# Patient Record
Sex: Male | Born: 1965 | Race: White | Hispanic: No | Marital: Married | Smoking: Never smoker
Health system: Southern US, Community
[De-identification: ages and names within clinical notes are randomized; demographics above are authoritative.]

## PROBLEM LIST (undated history)

## (undated) DIAGNOSIS — N400 Enlarged prostate without lower urinary tract symptoms: Secondary | ICD-10-CM

## (undated) DIAGNOSIS — I82409 Acute embolism and thrombosis of unspecified deep veins of unspecified lower extremity: Secondary | ICD-10-CM

## (undated) DIAGNOSIS — N200 Calculus of kidney: Secondary | ICD-10-CM

## (undated) DIAGNOSIS — G0490 Encephalitis and encephalomyelitis, unspecified: Secondary | ICD-10-CM

## (undated) DIAGNOSIS — R739 Hyperglycemia, unspecified: Secondary | ICD-10-CM

## (undated) DIAGNOSIS — I10 Essential (primary) hypertension: Secondary | ICD-10-CM

## (undated) DIAGNOSIS — E785 Hyperlipidemia, unspecified: Secondary | ICD-10-CM

## (undated) DIAGNOSIS — E119 Type 2 diabetes mellitus without complications: Secondary | ICD-10-CM

## (undated) HISTORY — DX: Benign prostatic hyperplasia without lower urinary tract symptoms: N40.0

## (undated) HISTORY — DX: Calculus of kidney: N20.0

## (undated) HISTORY — DX: Acute embolism and thrombosis of unspecified deep veins of unspecified lower extremity: I82.409

## (undated) HISTORY — DX: Hyperlipidemia, unspecified: E78.5

## (undated) HISTORY — DX: Essential (primary) hypertension: I10

## (undated) HISTORY — DX: Hyperglycemia, unspecified: R73.9

## (undated) HISTORY — DX: Encephalitis and encephalomyelitis, unspecified: G04.90

---
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
## 2001-02-21 ENCOUNTER — Ambulatory Visit (HOSPITAL_COMMUNITY): Admission: RE | Admit: 2001-02-21 | Discharge: 2001-02-21 | Payer: Self-pay | Admitting: Neurosurgery

## 2001-02-21 ENCOUNTER — Encounter: Payer: Self-pay | Admitting: Neurosurgery

## 2005-03-23 ENCOUNTER — Ambulatory Visit: Payer: Self-pay | Admitting: Internal Medicine

## 2005-03-31 ENCOUNTER — Ambulatory Visit: Payer: Self-pay

## 2007-08-14 ENCOUNTER — Observation Stay: Payer: Self-pay | Admitting: Specialist

## 2008-04-27 ENCOUNTER — Ambulatory Visit: Payer: Self-pay | Admitting: Urology

## 2008-04-28 ENCOUNTER — Ambulatory Visit: Payer: Self-pay | Admitting: Internal Medicine

## 2008-07-28 ENCOUNTER — Ambulatory Visit: Payer: Self-pay | Admitting: Internal Medicine

## 2008-08-06 ENCOUNTER — Ambulatory Visit: Payer: Self-pay | Admitting: Internal Medicine

## 2008-08-27 ENCOUNTER — Ambulatory Visit: Payer: Self-pay | Admitting: Internal Medicine

## 2013-01-28 ENCOUNTER — Telehealth: Payer: Self-pay | Admitting: Internal Medicine

## 2013-01-28 NOTE — Telephone Encounter (Signed)
Pt did see you over at [facility]. He has not seen you yet over at this office. He was seeing Dr. And they found nerve blockage (Fascicular) on EKG it is not Arterial Blockage. Pt is wanting to know if this was always there or something new. He is trying to get a new job and they a denying him until they find out if this was always there or something new.

## 2013-01-28 NOTE — Telephone Encounter (Signed)
Need the information from the physician he is seeing now and I need the information from [place] for comparison

## 2013-01-28 NOTE — Telephone Encounter (Signed)
Request for records has been sent. Waiting on results.

## 2013-01-30 NOTE — Telephone Encounter (Signed)
Received records

## 2013-02-03 ENCOUNTER — Telehealth: Payer: Self-pay | Admitting: General Practice

## 2013-02-03 NOTE — Telephone Encounter (Signed)
Pt called back regarding his physical and EKG that had been completed incorrectly. Spoke with [facility] last week.   Ths is for an overseas mission, pt seen Visual merchandiser in [place]. Provider stated that he was not ok with the pt going on the mission trip stated that if PCP called and discussed or was ok with him going on the trip then he would sign the papers. This was suppose to have been completed within in 5 days. Tomorrow will be the 7th day due to weather. Please call back and advise pt what to do.

## 2013-02-03 NOTE — Telephone Encounter (Signed)
Records from [place] and [facility] with ekgs are being sent.

## 2013-02-05 ENCOUNTER — Telehealth: Payer: Self-pay | Admitting: Internal Medicine

## 2013-02-05 NOTE — Telephone Encounter (Signed)
Patient wants to know if he needs to see a Cardiologist or what should he do at this point.

## 2013-02-06 ENCOUNTER — Encounter: Payer: Self-pay | Admitting: Internal Medicine

## 2013-02-06 NOTE — Telephone Encounter (Signed)
See other message

## 2013-02-07 ENCOUNTER — Encounter: Payer: Self-pay | Admitting: Internal Medicine

## 2013-02-07 ENCOUNTER — Ambulatory Visit (INDEPENDENT_AMBULATORY_CARE_PROVIDER_SITE_OTHER): Payer: 59 | Admitting: Internal Medicine

## 2013-02-07 VITALS — BP 120/84 | HR 74 | Temp 98.6°F | Ht 68.5 in | Wt 191.8 lb

## 2013-02-07 DIAGNOSIS — Z0000 Encounter for general adult medical examination without abnormal findings: Secondary | ICD-10-CM

## 2013-02-07 DIAGNOSIS — R739 Hyperglycemia, unspecified: Secondary | ICD-10-CM

## 2013-02-07 DIAGNOSIS — N200 Calculus of kidney: Secondary | ICD-10-CM

## 2013-02-07 DIAGNOSIS — R7309 Other abnormal glucose: Secondary | ICD-10-CM

## 2013-02-07 DIAGNOSIS — E7800 Pure hypercholesterolemia, unspecified: Secondary | ICD-10-CM

## 2013-02-07 DIAGNOSIS — Z86718 Personal history of other venous thrombosis and embolism: Secondary | ICD-10-CM

## 2013-02-09 ENCOUNTER — Encounter: Payer: Self-pay | Admitting: Internal Medicine

## 2013-02-09 DIAGNOSIS — E1159 Type 2 diabetes mellitus with other circulatory complications: Secondary | ICD-10-CM | POA: Insufficient documentation

## 2013-02-09 DIAGNOSIS — Z86718 Personal history of other venous thrombosis and embolism: Secondary | ICD-10-CM | POA: Insufficient documentation

## 2013-02-09 DIAGNOSIS — E119 Type 2 diabetes mellitus without complications: Secondary | ICD-10-CM | POA: Insufficient documentation

## 2013-02-09 DIAGNOSIS — I152 Hypertension secondary to endocrine disorders: Secondary | ICD-10-CM | POA: Insufficient documentation

## 2013-02-09 DIAGNOSIS — E7800 Pure hypercholesterolemia, unspecified: Secondary | ICD-10-CM | POA: Insufficient documentation

## 2013-02-09 DIAGNOSIS — N200 Calculus of kidney: Secondary | ICD-10-CM | POA: Insufficient documentation

## 2013-02-09 NOTE — Progress Notes (Signed)
  Subjective:    Patient ID: Jackson Cooper, male    DOB: 10-06-1966, 47 y.o.   MRN: [id number]  HPI 47 year old male with past history of DVT, nephrolithiasis and hyperglycemia who comes in today as a work in to discuss a recent EKG.  He is planning to go on another mission trip.  Was being evaluated by [facility] in [place].  Was found to have a fascicular block on his EKG.  Was asked to have me evaluate him.  He states he is doing well.  Stays physically active and reports no chest pain or tightness with increased activity or exertion.  Working at [employer], [employer].  Very active.  No sob.  Overall he feels he is doing well.  No acid reflux or nausea.  Bowels doing well.     Past Medical History  Diagnosis Date  . Enlarged prostate   . Encephalitis 1970  . DVT (deep venous thrombosis) Diagnosed 9/08  . Nephrolithiasis   . Hyperglycemia     Current Outpatient Prescriptions on File Prior to Visit  Medication Sig Dispense Refill  . aspirin 81 MG tablet Take 81 mg by mouth daily. Take 1 tablet by mouth once a day      . cetirizine (ZYRTEC) 10 MG tablet Take 10 mg by mouth daily.      . Flaxseed Oil OIL by Does not apply route. 1000 mg      . LORazepam (ATIVAN) 0.5 MG tablet Take 0.5 mg by mouth every 8 (eight) hours. 1 tablet as directed       No current facility-administered medications on file prior to visit.    Review of Systems Patient denies any headache, lightheadedness or dizziness.  No sinus or allergy symptoms.  No chest pain, tightness or palpitations.  No increased shortness of breath, cough or congestion.  No nausea or vomiting.  No acid reflux.  No abdominal pain or cramping.  No bowel change, such as diarrhea, constipation, BRBPR or melana.  No urine change.  Overall he feels good.  Stays physically active and reports no cardiac symptoms with increased activity or exertion.       Objective:   Physical Exam Filed Vitals:   02/07/13 1150  BP: 120/84  Pulse: 74   Temp: 98.6 F (37 C)   Pulse 28  47 year old male in no acute distress.  HEENT:  Nares - clear.  Oropharynx - without lesions. NECK:  Supple.  Nontender.  No audible carotid bruit.  HEART:  Appears to be regular.   LUNGS:  No crackles or wheezing audible.  Respirations even and unlabored.   RADIAL PULSE:  Equal bilaterally.  ABDOMEN:  Soft.  Nontender.  Bowel sounds present and normal.  No audible abdominal bruit.    EXTREMITIES:  No increased edema present.  DP pulses palpable and equal bilaterally.         Assessment & Plan:  CARDIOVASCULAR.  Repeat EKG today revealed SR with left anterior fascicular block and no acute ischemic changes.  He is asymptomatic.  Stays physically active and reports no cardiac symptoms with increased activity or exertion.  Will have cardiology review to confirm no further w/up warranted.    PULMONARY.  Asymptomatic.    HEALTH MAINTENANCE.  Overdue a physical here.  Will schedule.  Colonoscopy 2002.

## 2013-02-09 NOTE — Assessment & Plan Note (Signed)
Low cholesterol diet and exercise.  Check lipid panel if not performed on his recent labs.  Obtain records for review.

## 2013-02-09 NOTE — Assessment & Plan Note (Signed)
States he just had labs performed through the [facility] evaluation.  Obtain records.  States sugar was "ok".

## 2013-02-09 NOTE — Assessment & Plan Note (Signed)
If off coumadin.  No reoccurrence.  Needs to get up and move around frequently with his flight - if he goes on the mission trip.

## 2013-02-09 NOTE — Assessment & Plan Note (Signed)
Asymptomatic.  Follow.   

## 2013-02-10 ENCOUNTER — Encounter: Payer: Self-pay | Admitting: Internal Medicine

## 2013-02-10 ENCOUNTER — Telehealth: Payer: Self-pay | Admitting: *Deleted

## 2013-02-10 NOTE — Telephone Encounter (Signed)
Patient called wanting to know if you have approved him yet? Patient would like a call back to let him know statis.

## 2013-02-10 NOTE — Telephone Encounter (Signed)
This is the one I talked to you about

## 2013-02-10 NOTE — Telephone Encounter (Signed)
Patient stated that this issue must be resolved tomorrow.

## 2013-02-11 ENCOUNTER — Telehealth: Payer: Self-pay | Admitting: Internal Medicine

## 2013-02-11 NOTE — Telephone Encounter (Signed)
Patient notified of appointment with cardiologist at 2:00 pm 02/11/13

## 2013-02-11 NOTE — Telephone Encounter (Signed)
See other message

## 2013-02-11 NOTE — Telephone Encounter (Signed)
I have tried to call pt this am and have left him a message to call back.  I need to talk to him when he calls.  I have spoke to cardiology and they have reviewed his ekg.

## 2013-02-11 NOTE — Telephone Encounter (Signed)
Pt called to let you know he just finished his stress test. Please call him with results

## 2013-02-11 NOTE — Telephone Encounter (Signed)
Patient notified

## 2013-02-11 NOTE — Telephone Encounter (Signed)
Call pt and let him know that Dr Gwen Pounds should have the information I need by tomorrow.

## 2013-02-12 ENCOUNTER — Encounter: Payer: Self-pay | Admitting: Internal Medicine

## 2013-02-14 ENCOUNTER — Telehealth: Payer: Self-pay | Admitting: Emergency Medicine

## 2013-02-14 NOTE — Telephone Encounter (Signed)
Patient came in requesting an order for a CXR prior to his apt on Monday. He would like to do this before coming in.Jackson KitchenMarland KitchenCould you place an order for this for a screening, due to employer request? Please advise

## 2013-02-14 NOTE — Telephone Encounter (Signed)
Will need to see him before ordering.

## 2013-02-14 NOTE — Telephone Encounter (Signed)
I don't know how I can order this before I see him.  After I see him, I will send him straight to xray from the appt. And it  Will still be done on Monday.  The TB skin test can be placed while he is here and can be read in three days after placed.

## 2013-02-14 NOTE — Telephone Encounter (Signed)
Patient said he needs cxr and tb skin test because his job is requesting it. He wants it done Monday if possible. I explained to him that you need to see him before ordering.

## 2013-02-17 ENCOUNTER — Encounter: Payer: Self-pay | Admitting: Internal Medicine

## 2013-02-17 ENCOUNTER — Ambulatory Visit (INDEPENDENT_AMBULATORY_CARE_PROVIDER_SITE_OTHER): Payer: 59 | Admitting: Internal Medicine

## 2013-02-17 ENCOUNTER — Ambulatory Visit (INDEPENDENT_AMBULATORY_CARE_PROVIDER_SITE_OTHER)
Admission: RE | Admit: 2013-02-17 | Discharge: 2013-02-17 | Disposition: A | Discharge status: Home / Self Care | Payer: 59 | Source: Ambulatory Visit | Attending: Internal Medicine | Admitting: Internal Medicine

## 2013-02-17 VITALS — BP 120/96 | HR 74 | Temp 98.7°F | Ht 68.5 in | Wt 193.2 lb

## 2013-02-17 DIAGNOSIS — Z86718 Personal history of other venous thrombosis and embolism: Secondary | ICD-10-CM

## 2013-02-17 DIAGNOSIS — R739 Hyperglycemia, unspecified: Secondary | ICD-10-CM

## 2013-02-17 DIAGNOSIS — E7800 Pure hypercholesterolemia, unspecified: Secondary | ICD-10-CM

## 2013-02-17 DIAGNOSIS — Z111 Encounter for screening for respiratory tuberculosis: Secondary | ICD-10-CM

## 2013-02-17 DIAGNOSIS — R7309 Other abnormal glucose: Secondary | ICD-10-CM

## 2013-02-17 DIAGNOSIS — N200 Calculus of kidney: Secondary | ICD-10-CM

## 2013-02-17 NOTE — Telephone Encounter (Signed)
Forwarding this to you.

## 2013-02-17 NOTE — Telephone Encounter (Signed)
Patient is aware 

## 2013-02-18 ENCOUNTER — Encounter: Payer: Self-pay | Admitting: Internal Medicine

## 2013-02-18 NOTE — Assessment & Plan Note (Signed)
Asymptomatic.  Follow.   

## 2013-02-18 NOTE — Assessment & Plan Note (Signed)
If off coumadin.  No reoccurrence.  Needs to get up and move around frequently with his flight - if he goes on the mission trip.

## 2013-02-18 NOTE — Progress Notes (Signed)
Subjective:    Patient ID: Jackson Cooper, male    DOB: 06-05-1966, 47 y.o.   MRN: [id number]  HPI 47 year old male with past history of DVT, nephrolithiasis and hyperglycemia who comes in today for a scheduled follow up and physical exam.   He is planning to go on another mission trip.  Was being evaluated by [facility] in [place].  Was found to have a fascicular block on his EKG.  Was asked to have me evaluate him.  See last note for details.  Had treadmill stress test.  Did well.  Stays physically active.  No chest pain or tightness with increased activity or exertion.  No sob.  No acid reflux.  States he has had extensive blood work recently.  States everything checked out fine.  No bowel change.  Needs a cxr and TB skin test.  Overall feels good.     Past Medical History  Diagnosis Date  . Enlarged prostate   . Encephalitis 1970  . DVT (deep venous thrombosis) Diagnosed 9/08  . Nephrolithiasis   . Hyperglycemia     Current Outpatient Prescriptions on File Prior to Visit  Medication Sig Dispense Refill  . aspirin 81 MG tablet Take 81 mg by mouth daily. Take 1 tablet by mouth once a day      . cetirizine (ZYRTEC) 10 MG tablet Take 10 mg by mouth daily.      . Flaxseed Oil OIL by Does not apply route. 1000 mg      . LORazepam (ATIVAN) 0.5 MG tablet Take 0.5 mg by mouth every 8 (eight) hours. 1 tablet as directed       No current facility-administered medications on file prior to visit.    Review of Systems Patient denies any headache, lightheadedness or dizziness.  No sinus or allergy symptoms.  No chest pain, tightness or palpitations.  No increased shortness of breath, cough or congestion.  No nausea or vomiting.  No acid reflux.  No abdominal pain or cramping.  No bowel change, such as diarrhea, constipation, BRBPR or melana.  No urine change.  Overall he feels good.  Stays physically active and reports no cardiac symptoms with increased activity or exertion.       Objective:   Physical Exam  Filed Vitals:   02/17/13 1435  BP: 120/96  Pulse: 74  Temp: 98.7 F (37.1 C)   Pulse 68, blood pressure recheck:  38-54/30  47 year old male in no acute distress.  HEENT:  Nares - clear.  Oropharynx - without lesions. NECK:  Supple.  Nontender.  No audible carotid bruit.  HEART:  Appears to be regular.   LUNGS:  No crackles or wheezing audible.  Respirations even and unlabored.   RADIAL PULSE:  Equal bilaterally.  ABDOMEN:  Soft.  Nontender.  Bowel sounds present and normal.  No audible abdominal bruit.  GU/PROSTATE:  Just checked.     EXTREMITIES:  No increased edema present.  DP pulses palpable and equal bilaterally.         Assessment & Plan:  CARDIOVASCULAR.  Repeat EKG last visit revealed SR with left anterior fascicular block.  He had an exercise treadmill test.  Did well.  Physically active with no symptoms.  Follow.      PULMONARY.  Asymptomatic.  Required to have a TB skin test and cxr.  TB skin test placed today.  CXR today.  ELEVATED BLOOD PRESSURE.  Noted on today's exam.  States he will spot check  his pressure and let me know if remains elevated.  He feels it is up today secondary to increased stress of preparing for this trip.  Follow.  Obtain recent labs.    HEALTH MAINTENANCE.  Physical today.  GU and prostate just checked.  Obtain records of most recent physical and labs.   Colonoscopy 2002.

## 2013-02-18 NOTE — Assessment & Plan Note (Signed)
Low cholesterol diet and exercise.  Check lipid panel if not performed on his recent labs.  Obtain records for review.

## 2013-02-18 NOTE — Assessment & Plan Note (Signed)
States he just had labs performed through the [facility] evaluation.  Obtain records.  States sugar was "ok".

## 2013-02-20 ENCOUNTER — Ambulatory Visit (INDEPENDENT_AMBULATORY_CARE_PROVIDER_SITE_OTHER): Payer: 59 | Admitting: *Deleted

## 2013-02-20 ENCOUNTER — Telehealth: Payer: Self-pay | Admitting: *Deleted

## 2013-02-20 DIAGNOSIS — Z111 Encounter for screening for respiratory tuberculosis: Secondary | ICD-10-CM

## 2013-02-20 NOTE — Addendum Note (Signed)
Addended by: Marlene Lard on: 02/20/2013 11:38 AM   Modules accepted: Orders

## 2013-02-20 NOTE — Telephone Encounter (Signed)
Patient was in the office this morning to have his tb results read. He requested rx for Flonase. Patient stated that Flonase works better for him the zyrtec.

## 2013-02-21 MED ORDER — FLUTICASONE PROPIONATE 50 MCG/ACT NA SUSP: 2.0000 | Freq: Every day | NASAL | Status: AC

## 2013-02-21 NOTE — Telephone Encounter (Signed)
Notify pt I sent in the Flonase rx to [facility] [address].  Let us know if problems.

## 2013-02-21 NOTE — Telephone Encounter (Signed)
Patient notified

## 2013-03-13 NOTE — Progress Notes (Signed)
Pt came into the office for a PPD skin test.

## 2013-06-11 ENCOUNTER — Telehealth: Payer: Self-pay | Admitting: Internal Medicine

## 2013-06-11 NOTE — Telephone Encounter (Signed)
Pt.notified

## 2013-06-11 NOTE — Telephone Encounter (Signed)
Pt states at last visit he and Dr. Lorin Picket discussed drug malarium but he never received it.  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  Pharmacy = [facility] [place].  Pt asking for call so that he can pick it up asap.  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

## 2013-06-11 NOTE — Telephone Encounter (Signed)
I always refer people to the travel clinic for recommendations prior to going to places like he is going to.  They will give him the medications needed and make sure all immunizations are updated.  This is what he will need to do.  There is a travel clinic in [place].

## 2013-06-12 ENCOUNTER — Encounter: Payer: 59 | Admitting: Internal Medicine

## 2013-10-02 ENCOUNTER — Other Ambulatory Visit: Payer: Self-pay

## 2014-05-19 ENCOUNTER — Telehealth: Payer: Self-pay | Admitting: Internal Medicine

## 2014-05-19 NOTE — Telephone Encounter (Signed)
Noted.  Any problems or increasing blood pressure, will need to be seen before 05/22/14 appt.

## 2014-05-19 NOTE — Telephone Encounter (Signed)
Please advise where you would like to put patient on your schedule.

## 2014-05-19 NOTE — Telephone Encounter (Signed)
Spoke with pt, BP yesterday 164/91, today 149/98. Denies any CP, SOB, any acute symptoms. States he has been trying to watch his diet, but has been working in [place] and foods all high in salt. Appt scheduled for 05/22/14 at 1:30.

## 2014-05-19 NOTE — Telephone Encounter (Signed)
I can see him on 05/22/14 at 1:30 - block 30 minute slot.  Need to confirm how high blood pressure and if any other acute symptoms.  If acute symptoms or problems, needs evaluation today and then I can f/u on Friday.  Thanks.

## 2014-05-19 NOTE — Telephone Encounter (Signed)
[place] and his blood pressure is elevated . He wife called and stated that he needs to see Dr. Lorin PicketScott.

## 2014-05-20 NOTE — Telephone Encounter (Signed)
Pt aware.

## 2014-05-22 ENCOUNTER — Encounter: Payer: Self-pay | Admitting: Internal Medicine

## 2014-05-22 ENCOUNTER — Telehealth: Payer: Self-pay | Admitting: *Deleted

## 2014-05-22 ENCOUNTER — Ambulatory Visit: Payer: 59 | Admitting: Adult Health

## 2014-05-22 ENCOUNTER — Ambulatory Visit (INDEPENDENT_AMBULATORY_CARE_PROVIDER_SITE_OTHER): Payer: 59 | Admitting: Internal Medicine

## 2014-05-22 VITALS — BP 120/84 | HR 76 | Temp 98.1°F | Ht 68.5 in | Wt 201.5 lb

## 2014-05-22 DIAGNOSIS — I10 Essential (primary) hypertension: Secondary | ICD-10-CM

## 2014-05-22 DIAGNOSIS — R7309 Other abnormal glucose: Secondary | ICD-10-CM

## 2014-05-22 DIAGNOSIS — E7800 Pure hypercholesterolemia, unspecified: Secondary | ICD-10-CM

## 2014-05-22 DIAGNOSIS — N200 Calculus of kidney: Secondary | ICD-10-CM

## 2014-05-22 DIAGNOSIS — R739 Hyperglycemia, unspecified: Secondary | ICD-10-CM

## 2014-05-22 DIAGNOSIS — Z86718 Personal history of other venous thrombosis and embolism: Secondary | ICD-10-CM

## 2014-05-22 DIAGNOSIS — R9431 Abnormal electrocardiogram [ECG] [EKG]: Secondary | ICD-10-CM

## 2014-05-22 MED ORDER — AMLODIPINE BESYLATE 5 MG PO TABS: 5.0000 mg | ORAL_TABLET | Freq: Every day | ORAL | Status: DC

## 2014-05-22 NOTE — Telephone Encounter (Signed)
Pt is coming Monday what labs and dx? 

## 2014-05-22 NOTE — Patient Instructions (Signed)
Orders placed for labs

## 2014-05-22 NOTE — Telephone Encounter (Signed)
Orders placed for labs

## 2014-05-25 ENCOUNTER — Encounter: Payer: Self-pay | Admitting: Internal Medicine

## 2014-05-25 ENCOUNTER — Other Ambulatory Visit (INDEPENDENT_AMBULATORY_CARE_PROVIDER_SITE_OTHER): Payer: 59

## 2014-05-25 DIAGNOSIS — I10 Essential (primary) hypertension: Secondary | ICD-10-CM

## 2014-05-25 DIAGNOSIS — R7309 Other abnormal glucose: Secondary | ICD-10-CM

## 2014-05-25 DIAGNOSIS — R9431 Abnormal electrocardiogram [ECG] [EKG]: Secondary | ICD-10-CM | POA: Insufficient documentation

## 2014-05-25 DIAGNOSIS — R739 Hyperglycemia, unspecified: Secondary | ICD-10-CM

## 2014-05-25 DIAGNOSIS — E7800 Pure hypercholesterolemia, unspecified: Secondary | ICD-10-CM

## 2014-05-25 LAB — CBC WITH DIFFERENTIAL/PLATELET
BASOS ABS: 0 10*3/uL (ref 0.0–0.1)
Basophils Relative: 0.5 % (ref 0.0–3.0)
EOS PCT: 2.3 % (ref 0.0–5.0)
Eosinophils Absolute: 0.1 10*3/uL (ref 0.0–0.7)
HEMATOCRIT: 47.5 % (ref 39.0–52.0)
Hemoglobin: 15.9 g/dL (ref 13.0–17.0)
LYMPHS ABS: 1.9 10*3/uL (ref 0.7–4.0)
LYMPHS PCT: 29.9 % (ref 12.0–46.0)
MCHC: 33.5 g/dL (ref 30.0–36.0)
MCV: 91.1 fl (ref 78.0–100.0)
MONOS PCT: 8.5 % (ref 3.0–12.0)
Monocytes Absolute: 0.5 10*3/uL (ref 0.1–1.0)
Neutro Abs: 3.7 10*3/uL (ref 1.4–7.7)
Neutrophils Relative %: 58.8 % (ref 43.0–77.0)
PLATELETS: 193 10*3/uL (ref 150.0–400.0)
RBC: 5.22 Mil/uL (ref 4.22–5.81)
RDW: 13.3 % (ref 11.5–15.5)
WBC: 6.2 10*3/uL (ref 4.0–10.5)

## 2014-05-25 LAB — COMPREHENSIVE METABOLIC PANEL
ALBUMIN: 4.6 g/dL (ref 3.5–5.2)
ALT: 37 U/L (ref 0–53)
AST: 29 U/L (ref 0–37)
Alkaline Phosphatase: 56 U/L (ref 39–117)
BILIRUBIN TOTAL: 1.1 mg/dL (ref 0.2–1.2)
BUN: 14 mg/dL (ref 6–23)
CALCIUM: 9.4 mg/dL (ref 8.4–10.5)
CHLORIDE: 106 meq/L (ref 96–112)
CO2: 25 meq/L (ref 19–32)
Creatinine, Ser: 1.1 mg/dL (ref 0.4–1.5)
GFR: 79.53 mL/min (ref >60.00)
GLUCOSE: 159 mg/dL — AB (ref 70–99)
POTASSIUM: 4.5 meq/L (ref 3.5–5.1)
SODIUM: 140 meq/L (ref 135–145)
TOTAL PROTEIN: 7.3 g/dL (ref 6.0–8.3)

## 2014-05-25 LAB — LIPID PANEL
CHOL/HDL RATIO: 5
Cholesterol: 199 mg/dL (ref 0–200)
HDL: 37.7 mg/dL — AB (ref >39.00)
LDL Cholesterol: 138 mg/dL — ABNORMAL HIGH (ref 0–99)
NONHDL: 161.3
Triglycerides: 117 mg/dL (ref 0.0–149.0)
VLDL: 23.4 mg/dL (ref 0.0–40.0)

## 2014-05-25 LAB — URINALYSIS, ROUTINE W REFLEX MICROSCOPIC
BILIRUBIN URINE: NEGATIVE
Hgb urine dipstick: NEGATIVE
KETONES UR: NEGATIVE
LEUKOCYTES UA: NEGATIVE
Nitrite: NEGATIVE
RBC / HPF: NONE SEEN (ref 0–?)
SPECIFIC GRAVITY, URINE: 1.02 (ref 1.000–1.030)
Total Protein, Urine: NEGATIVE
URINE GLUCOSE: NEGATIVE
UROBILINOGEN UA: 0.2 (ref 0.0–1.0)
WBC, UA: NONE SEEN (ref 0–?)
pH: 6 (ref 5.0–8.0)

## 2014-05-25 LAB — HEMOGLOBIN A1C: HEMOGLOBIN A1C: 7.3 % — AB (ref 4.6–6.5)

## 2014-05-25 LAB — MICROALBUMIN / CREATININE URINE RATIO
MICROALB/CREAT RATIO: 0.3 mg/g (ref 0.0–30.0)
Microalb, Ur: 0.6 mg/dL (ref 0.0–1.9)

## 2014-05-25 LAB — TSH: TSH: 1.73 u[IU]/mL (ref 0.35–4.50)

## 2014-05-25 NOTE — Assessment & Plan Note (Signed)
Blood pressure elevated.  Will check metabolic panel.  Given that he will be in [place] and is returning next week, I will start amlodipine 5mg  q day.  (will hold on starting an ACE inhibitor since renal function and potassium will need to be monitored).  Instructed him that he will need close monitoring of his blood pressure and adjustments made if necessary.  To f/u with me when he returns.

## 2014-05-25 NOTE — Assessment & Plan Note (Signed)
Asymptomatic.  Follow.   

## 2014-05-25 NOTE — Assessment & Plan Note (Signed)
Low cholesterol diet and exercise.  Check lipid panel.   

## 2014-05-25 NOTE — Progress Notes (Signed)
Subjective:    Patient ID: Jackson DouglasRicky L Mckeel, male    DOB: 1965-12-01, 48 y.o.   MRN: [id number]  Hypertension  48 year old male with past history of DVT, nephrolithiasis and hyperglycemia who comes in today as a work in with concerns regarding elevated blood pressure.   He just returned home from [place] and is planning to leave again next week.  While home, he has noticed his blood pressure is elevated.  Has been averaging 158-160s/90s.  He feels this is related to his lifestyle change while in [place].  States he has started exercising some now.  Has adjusted his diet some.  Reports eating foods with more sodium while in [place].  Has noticed that his blood pressure seems to be trending down.  He brings in a form to complete to clear him for physical fitness testing.  Of note, he had a treadmill stress test last year.  Did well.  Stays physically active.  No chest pain or tightness with increased activity or exertion.  No sob.  No acid reflux.      Past Medical History  Diagnosis Date  . Enlarged prostate   . Encephalitis 1970  . DVT (deep venous thrombosis) Diagnosed 9/08  . Nephrolithiasis   . Hyperglycemia     Current Outpatient Prescriptions on File Prior to Visit  Medication Sig Dispense Refill  . aspirin 81 MG tablet Take 81 mg by mouth daily. Take 1 tablet by mouth once a day      . cetirizine (ZYRTEC) 10 MG tablet Take 10 mg by mouth daily as needed.       . Flaxseed Oil OIL by Does not apply route. 1000 mg      . fluticasone (FLONASE) 50 MCG/ACT nasal spray Place 2 sprays into the nose daily.  16 g  2  . LORazepam (ATIVAN) 0.5 MG tablet Take 0.5 mg by mouth every 8 (eight) hours. 1 tablet as directed       No current facility-administered medications on file prior to visit.    Review of Systems Patient denies any headache, lightheadedness or dizziness.  No sinus or allergy symptoms.  No chest pain, tightness or palpitations.  No increased shortness of breath, cough or congestion.  No  nausea or vomiting.  No acid reflux.  No abdominal pain or cramping.  No bowel change, such as diarrhea, constipation, BRBPR or melana.  No urine change.  Overall he feels good.  Stays physically active and reports no cardiac symptoms with increased activity or exertion.  Has gained some weight.        Objective:   Physical Exam  Filed Vitals:   05/22/14 1304  BP: 120/84  Pulse: 76  Temp: 98.1 F (36.7 C)   Blood pressure recheck:  138/94-96, pulse 176  48 year old male in no acute distress.  HEENT:  Nares - clear.  Oropharynx - without lesions. NECK:  Supple.  Nontender.  No audible carotid bruit.  HEART:  Appears to be regular.   LUNGS:  No crackles or wheezing audible.  Respirations even and unlabored.   RADIAL PULSE:  Equal bilaterally.  ABDOMEN:  Soft.  Nontender.  Bowel sounds present and normal.  No audible abdominal bruit.      EXTREMITIES:  No increased edema present.  DP pulses palpable and equal bilaterally.         Assessment & Plan:  CARDIOVASCULAR.  Repeat EKG today revealed SR with TWI in III and minimal TWI in aVF.  Some minimal ST elevation in v2.  Given these findings, will have cardiology review.  Needs clearance for the physical abilities test.  Dr Gwen PoundsKowalski to review.  Discussed with pt.  May need appt.        PULMONARY.  Asymptomatic.   HEALTH MAINTENANCE.  Overdue a physical.    Colonoscopy 2002.  Will need f/u colonoscopy.    I spent 25 minutes with the patient and more than 50% of the time was spent in consultation regarding the above.

## 2014-05-25 NOTE — Assessment & Plan Note (Signed)
Needs clearance for physical abilities test.  EKG as outlined.  Will have cardiology review for clearance.

## 2014-05-25 NOTE — Assessment & Plan Note (Signed)
Low carb diet and exercise.  Follow.  Check met b and a1c.    

## 2014-05-25 NOTE — Assessment & Plan Note (Signed)
If off coumadin.  No reoccurrence.  Needs to get up and move around frequently with his flight.

## 2014-05-26 ENCOUNTER — Encounter: Payer: Self-pay | Admitting: *Deleted

## 2014-07-31 ENCOUNTER — Encounter: Payer: Self-pay | Admitting: Internal Medicine

## 2014-07-31 MED ORDER — AMLODIPINE BESYLATE 5 MG PO TABS: 5.0000 mg | ORAL_TABLET | Freq: Every day | ORAL | Status: DC

## 2014-07-31 NOTE — Telephone Encounter (Signed)
My chart message sent to pt notifying of refill sent in and to let us know when home so can schedule physical.

## 2014-11-16 ENCOUNTER — Other Ambulatory Visit: Payer: Self-pay | Admitting: *Deleted

## 2014-11-16 MED ORDER — AMLODIPINE BESYLATE 5 MG PO TABS: 5.0000 mg | ORAL_TABLET | Freq: Every day | ORAL | Status: DC

## 2015-05-17 ENCOUNTER — Other Ambulatory Visit: Payer: Self-pay | Admitting: Internal Medicine

## 2015-06-16 ENCOUNTER — Other Ambulatory Visit: Payer: Self-pay

## 2015-06-16 MED ORDER — AMLODIPINE BESYLATE 5 MG PO TABS: 5.0000 mg | ORAL_TABLET | Freq: Every day | ORAL | Status: DC

## 2015-06-16 NOTE — Telephone Encounter (Signed)
ok'd refill until September appt.  Needs to keep this appt for more refills.

## 2015-06-16 NOTE — Telephone Encounter (Signed)
Patient's pharmacy requested refill.  Last month refill was done with only 30day supply, patient needed appointment.  Patient has an appointment but it is in September.  Please advise refill?

## 2015-07-20 ENCOUNTER — Telehealth: Payer: Self-pay | Admitting: Internal Medicine

## 2015-07-20 DIAGNOSIS — R739 Hyperglycemia, unspecified: Secondary | ICD-10-CM

## 2015-07-20 DIAGNOSIS — I10 Essential (primary) hypertension: Secondary | ICD-10-CM

## 2015-07-20 DIAGNOSIS — E7800 Pure hypercholesterolemia, unspecified: Secondary | ICD-10-CM

## 2015-07-20 NOTE — Telephone Encounter (Signed)
I have ordered the labs.  Please schedule lab appt prior to cpe.  Thanks

## 2015-07-20 NOTE — Telephone Encounter (Signed)
Pt would like to know is any blood work due before his CPE? IF so need orders. Thank You!

## 2015-07-20 NOTE — Telephone Encounter (Signed)
Please advise lab orders.

## 2015-07-30 ENCOUNTER — Ambulatory Visit: Payer: 59 | Admitting: Internal Medicine

## 2015-08-17 ENCOUNTER — Other Ambulatory Visit: Payer: Self-pay | Admitting: Internal Medicine

## 2015-08-17 NOTE — Telephone Encounter (Signed)
Last OV 6.26.15.  Please advise refill

## 2015-08-18 NOTE — Telephone Encounter (Signed)
ok'd refill amlodipine #30 with no refills.  Instructed to keep scheduled appt.

## 2015-09-13 ENCOUNTER — Other Ambulatory Visit: Payer: Self-pay | Admitting: Internal Medicine

## 2015-09-13 ENCOUNTER — Encounter: Payer: Self-pay | Admitting: Internal Medicine

## 2015-09-13 ENCOUNTER — Ambulatory Visit (INDEPENDENT_AMBULATORY_CARE_PROVIDER_SITE_OTHER): Payer: BC Managed Care – PPO | Admitting: Internal Medicine

## 2015-09-13 VITALS — BP 120/80 | HR 61 | Temp 98.1°F | Resp 18 | Ht 68.5 in | Wt 198.2 lb

## 2015-09-13 DIAGNOSIS — Z658 Other specified problems related to psychosocial circumstances: Secondary | ICD-10-CM

## 2015-09-13 DIAGNOSIS — E7800 Pure hypercholesterolemia, unspecified: Secondary | ICD-10-CM

## 2015-09-13 DIAGNOSIS — F439 Reaction to severe stress, unspecified: Secondary | ICD-10-CM

## 2015-09-13 DIAGNOSIS — I10 Essential (primary) hypertension: Secondary | ICD-10-CM

## 2015-09-13 DIAGNOSIS — R739 Hyperglycemia, unspecified: Secondary | ICD-10-CM | POA: Diagnosis not present

## 2015-09-13 LAB — HEPATIC FUNCTION PANEL
ALBUMIN: 4.1 g/dL (ref 3.5–5.2)
ALT: 26 U/L (ref 0–53)
AST: 21 U/L (ref 0–37)
Alkaline Phosphatase: 58 U/L (ref 39–117)
Bilirubin, Direct: 0.1 mg/dL (ref 0.0–0.3)
TOTAL PROTEIN: 6.4 g/dL (ref 6.0–8.3)
Total Bilirubin: 0.8 mg/dL (ref 0.2–1.2)

## 2015-09-13 LAB — LIPID PANEL
CHOLESTEROL: 194 mg/dL (ref 0–200)
HDL: 33.7 mg/dL — AB (ref >39.00)
LDL CALC: 138 mg/dL — AB (ref 0–99)
NonHDL: 159.86
Total CHOL/HDL Ratio: 6
Triglycerides: 110 mg/dL (ref 0.0–149.0)
VLDL: 22 mg/dL (ref 0.0–40.0)

## 2015-09-13 LAB — BASIC METABOLIC PANEL
BUN: 15 mg/dL (ref 6–23)
CALCIUM: 9.3 mg/dL (ref 8.4–10.5)
CO2: 28 mEq/L (ref 19–32)
Chloride: 104 mEq/L (ref 96–112)
Creatinine, Ser: 1.02 mg/dL (ref 0.40–1.50)
GFR: 81.81 mL/min (ref >60.00)
Glucose, Bld: 156 mg/dL — ABNORMAL HIGH (ref 70–99)
POTASSIUM: 4.1 meq/L (ref 3.5–5.1)
SODIUM: 140 meq/L (ref 135–145)

## 2015-09-13 LAB — CBC WITH DIFFERENTIAL/PLATELET
BASOS PCT: 0.6 % (ref 0.0–3.0)
Basophils Absolute: 0 10*3/uL (ref 0.0–0.1)
EOS ABS: 0.1 10*3/uL (ref 0.0–0.7)
EOS PCT: 2.1 % (ref 0.0–5.0)
HEMATOCRIT: 45.8 % (ref 39.0–52.0)
HEMOGLOBIN: 15.4 g/dL (ref 13.0–17.0)
LYMPHS PCT: 34.7 % (ref 12.0–46.0)
Lymphs Abs: 1.9 10*3/uL (ref 0.7–4.0)
MCHC: 33.6 g/dL (ref 30.0–36.0)
MCV: 90.2 fl (ref 78.0–100.0)
MONO ABS: 0.4 10*3/uL (ref 0.1–1.0)
Monocytes Relative: 7.9 % (ref 3.0–12.0)
Neutro Abs: 3 10*3/uL (ref 1.4–7.7)
Neutrophils Relative %: 54.7 % (ref 43.0–77.0)
Platelets: 180 10*3/uL (ref 150.0–400.0)
RBC: 5.07 Mil/uL (ref 4.22–5.81)
RDW: 13.1 % (ref 11.5–15.5)
WBC: 5.5 10*3/uL (ref 4.0–10.5)

## 2015-09-13 LAB — HEMOGLOBIN A1C: HEMOGLOBIN A1C: 7.6 % — AB (ref 4.6–6.5)

## 2015-09-13 LAB — MICROALBUMIN / CREATININE URINE RATIO
CREATININE, U: 249.9 mg/dL
MICROALB UR: 1 mg/dL (ref 0.0–1.9)
Microalb Creat Ratio: 0.4 mg/g (ref 0.0–30.0)

## 2015-09-13 LAB — TSH: TSH: 0.89 u[IU]/mL (ref 0.35–4.50)

## 2015-09-13 MED ORDER — LISINOPRIL 10 MG PO TABS: 10.0000 mg | ORAL_TABLET | Freq: Every day | ORAL | Status: DC

## 2015-09-13 MED ORDER — AMLODIPINE BESYLATE 5 MG PO TABS: 5.0000 mg | ORAL_TABLET | Freq: Every day | ORAL | Status: DC

## 2015-09-13 NOTE — Progress Notes (Signed)
Order placed for f/u met b - in two weeks.  

## 2015-09-13 NOTE — Progress Notes (Signed)
Patient ID: Jackson Cooper, male   DOB: [birth year], 49 y.o.   MRN: [id number]   Subjective:    Patient ID: Jackson Cooper, male    DOB: [birth year], 49 y.o.   MRN: [id number]  HPI  Patient with past history of hypertension, hyperglycemia and hypercholesterolemia who comes in today to follow up on these issues.  I have not seen him in over one year.  States he has been under increased stress with his job.  Blood pressure has been elevated.  States blood pressure is averaging 160/95.  No significant headache.  Does report some neck issues.  States this will lead to headache if he is positioned wrong through the night.  No dizziness or light headedness reported.  No chest pain or tightness.  No sob.  No acid reflux reported.  Bowels doing well.  Taking amlodipine regularly.     Past Medical History  Diagnosis Date  . Enlarged prostate   . Encephalitis 1970  . DVT (deep venous thrombosis) ([place]) Diagnosed 9/08  . Nephrolithiasis   . Hyperglycemia    Past Surgical History  Procedure Laterality Date  . Eye surgery  1972   Family History  Problem Relation Age of Onset  . Hypertension Mother   . Breast cancer Sister    Social History   Social History  . Marital Status: Married    Spouse Name: N/A  . Number of Children: N/A  . Years of Education: N/A   Social History Main Topics  . Smoking status: Never Smoker   . Smokeless tobacco: Never Used  . Alcohol Use: No  . Drug Use: No  . Sexual Activity: Not Asked   Other Topics Concern  . None   Social History Narrative    Outpatient Encounter Prescriptions as of 09/13/2015  Medication Sig  . amLODipine (NORVASC) 5 MG tablet Take 1 tablet (5 mg total) by mouth daily.  [name] aspirin 81 MG tablet Take 81 mg by mouth daily. Take 1 tablet by mouth once a day  . cetirizine (ZYRTEC) 10 MG tablet Take 10 mg by mouth daily as needed.   . Flaxseed Oil OIL by Does not apply route. 1000 mg  . fluticasone (FLONASE) 50 MCG/ACT nasal spray Place 2  sprays into the nose daily.  [name] LORazepam (ATIVAN) 0.5 MG tablet Take 0.5 mg by mouth every 8 (eight) hours. 1 tablet as directed  . [DISCONTINUED] amLODipine (NORVASC) 5 MG tablet Take 1 tablet (5 mg total) by mouth daily. Keep scheduled appt.  [name] lisinopril (PRINIVIL,ZESTRIL) 10 MG tablet Take 1 tablet (10 mg total) by mouth daily.   No facility-administered encounter medications on file as of 09/13/2015.    Review of Systems  Constitutional: Negative for appetite change and unexpected weight change.       Not watching his sugar.  Not watching what he eats.  States he tries some, but is not real serious about his diet.    HENT: Negative for congestion and sinus pressure.   Eyes: Negative for discharge and visual disturbance.  Respiratory: Negative for cough, chest tightness and shortness of breath.   Cardiovascular: Negative for chest pain, palpitations and leg swelling.  Gastrointestinal: Negative for nausea, vomiting, abdominal pain and diarrhea.  Genitourinary: Negative for dysuria and difficulty urinating.  Musculoskeletal: Negative for back pain and joint swelling.  Neurological: Positive for headaches (occasional.  he feels related to his neck.  ). Negative for dizziness and light-headedness.  Psychiatric/Behavioral: Negative for dysphoric mood and agitation.  Increased stress as outlined.         Objective:     Blood pressure rechecked by me:  140/84  Physical Exam  Constitutional: He appears well-developed and well-nourished. No distress.  HENT:  Nose: Nose normal.  Mouth/Throat: Oropharynx is clear and moist.  Eyes: Conjunctivae are normal. Right eye exhibits no discharge. Left eye exhibits no discharge.  Neck: Neck supple. No thyromegaly present.  Cardiovascular: Normal rate and regular rhythm.   Pulmonary/Chest: Effort normal and breath sounds normal. No respiratory distress.  Abdominal: Soft. Bowel sounds are normal. There is no tenderness.  Musculoskeletal: He  exhibits no edema or tenderness.  Lymphadenopathy:    He has no cervical adenopathy.  Skin: No rash noted. No erythema.  Psychiatric: He has a normal mood and affect. His behavior is normal.    BP 120/80 mmHg  Pulse 61  Temp(Src) 98.1 F (36.7 C) (Oral)  Resp 18  Ht 5' 8.5" (1.74 m)  Wt 198 lb 4 oz (89.926 kg)  BMI 29.70 kg/m2  SpO2 96% Wt Readings from Last 3 Encounters:  09/13/15 198 lb 4 oz (89.926 kg)  05/22/14 201 lb 8 oz (91.4 kg)  02/17/13 193 lb 4 oz (87.658 kg)     Lab Results  Component Value Date   WBC 6.2 05/25/2014   HGB 15.9 05/25/2014   HCT 47.5 05/25/2014   PLT 193.0 05/25/2014   GLUCOSE 159* 05/25/2014   CHOL 199 05/25/2014   TRIG 117.0 05/25/2014   HDL 37.70* 05/25/2014   LDLCALC 138* 05/25/2014   ALT 37 05/25/2014   AST 29 05/25/2014   NA 140 05/25/2014   K 4.5 05/25/2014   CL 106 05/25/2014   CREATININE 1.1 05/25/2014   BUN 14 05/25/2014   CO2 25 05/25/2014   TSH 1.73 05/25/2014   HGBA1C 7.3* 05/25/2014   MICROALBUR 0.6 05/25/2014        Assessment & Plan:   Problem List Items Addressed This Visit    Essential hypertension, benign - Primary    Blood pressure elevated.  Outside blood pressure checks averaging 160/90s.  Start lisinopril 60m q day.  Check metabolic panel today to reestablish baseline renal function.  Also will check in two weeks after starting lisinopril.        Relevant Medications   amLODipine (NORVASC) 5 MG tablet   lisinopril (PRINIVIL,ZESTRIL) 10 MG tablet   Other Relevant Orders   CBC with Differential/Platelet   Basic metabolic panel   Hypercholesterolemia    Low cholesterol diet and exercise.  Check lipid panel today.        Relevant Medications   amLODipine (NORVASC) 5 MG tablet   lisinopril (PRINIVIL,ZESTRIL) 10 MG tablet   Other Relevant Orders   Lipid panel   Hepatic function panel   Hyperglycemia    Low carb diet and exercise.  Follow met b and a1c.  Discussed diet and exercise.        Relevant  Orders   TSH   Hemoglobin A1c   Microalbumin / creatinine urine ratio   Stress    Increased stress as outlined.  Discussed working on sChild psychotherapist  Treat blood pressure.  Follow.            SEinar Pheasant MD

## 2015-09-13 NOTE — Assessment & Plan Note (Signed)
Low carb diet and exercise.  Follow met b and a1c.  Discussed diet and exercise.   

## 2015-09-13 NOTE — Progress Notes (Signed)
Pre-visit discussion using our clinic review tool. No additional management support is needed unless otherwise documented below in the visit note.  

## 2015-09-13 NOTE — Assessment & Plan Note (Signed)
Increased stress as outlined.  Discussed working on Optician, dispensingstress management.  Treat blood pressure.  Follow.

## 2015-09-13 NOTE — Assessment & Plan Note (Signed)
Blood pressure elevated.  Outside blood pressure checks averaging 160/90s.  Start lisinopril 10mg  q day.  Check metabolic panel today to reestablish baseline renal function.  Also will check in two weeks after starting lisinopril.

## 2015-09-13 NOTE — Assessment & Plan Note (Signed)
Low cholesterol diet and exercise.  Check lipid panel today.   

## 2015-09-14 ENCOUNTER — Telehealth: Payer: Self-pay

## 2015-09-14 ENCOUNTER — Ambulatory Visit: Payer: BC Managed Care – PPO | Admitting: Internal Medicine

## 2015-09-14 ENCOUNTER — Telehealth: Payer: Self-pay | Admitting: *Deleted

## 2015-09-14 NOTE — Telephone Encounter (Signed)
I need to discuss several things with him including diabetes and treatment and cholesterol , etc.  This is more than we can do over the phone.  I can see him at 4:00 on Tuesday 09/21/15 - end of day may help his schedule since [place] in [place].

## 2015-09-14 NOTE — Telephone Encounter (Signed)
Left message to return my call.  

## 2015-09-14 NOTE — Telephone Encounter (Signed)
Called pt due to the fact he may need to cancel appt today. He may callback to confirm.

## 2015-09-14 NOTE — Telephone Encounter (Signed)
Pt started the lisinopril today. He will not be able to make is appointment due to him working [place]. He said if anything needed to be discussed about his labs or medications he would be available at work to get in [place] with. His work number to get in touch with him best is [phone].

## 2015-09-21 ENCOUNTER — Encounter: Payer: Self-pay | Admitting: Internal Medicine

## 2015-09-21 ENCOUNTER — Ambulatory Visit (INDEPENDENT_AMBULATORY_CARE_PROVIDER_SITE_OTHER): Payer: BC Managed Care – PPO | Admitting: Internal Medicine

## 2015-09-21 VITALS — BP 120/80 | HR 78 | Temp 98.3°F | Resp 18 | Ht 68.5 in | Wt 201.0 lb

## 2015-09-21 DIAGNOSIS — E119 Type 2 diabetes mellitus without complications: Secondary | ICD-10-CM

## 2015-09-21 DIAGNOSIS — Z658 Other specified problems related to psychosocial circumstances: Secondary | ICD-10-CM

## 2015-09-21 DIAGNOSIS — I10 Essential (primary) hypertension: Secondary | ICD-10-CM | POA: Diagnosis not present

## 2015-09-21 DIAGNOSIS — F439 Reaction to severe stress, unspecified: Secondary | ICD-10-CM

## 2015-09-21 DIAGNOSIS — E7800 Pure hypercholesterolemia, unspecified: Secondary | ICD-10-CM

## 2015-09-21 NOTE — Progress Notes (Signed)
Patient ID: Jackson Cooper, male   DOB: [birth year], 49 y.o.   MRN: [id number]   Subjective:    Patient ID: Jackson Cooper, male    DOB: 1966-10-01, 49 y.o.   MRN: [id number]  HPI  Patient with past history of hypertension, diabetes and hypercholesterolemia.  He comes in today to follow up on these issues.  He is accompanied by his wife.  History obtained from both of them.  Recent a1c 7.6.  Discussed diet and exercise.  Discussed medication.  He declines to start.  We went over specifics regarding diet adjustment.  Discussed lifestyles referral.  Discussed checking his sugars.  He declines glucometer checks.  Declines medication.  We discussed exercise.  Also discussed cholesterol levels.  He has already started with diet changes.  No chest pain or tightness.  No sob.  No abdominal pain or cramping.  Bowels stable.    Past Medical History  Diagnosis Date  . Enlarged prostate   . Encephalitis 1970  . DVT (deep venous thrombosis) ([place]) Diagnosed 9/08  . Nephrolithiasis   . Hyperglycemia    Past Surgical History  Procedure Laterality Date  . Eye surgery  1972   Family History  Problem Relation Age of Onset  . Hypertension Mother   . Breast cancer Sister    Social History   Social History  . Marital Status: Married    Spouse Name: N/A  . Number of Children: N/A  . Years of Education: N/A   Social History Main Topics  . Smoking status: Never Smoker   . Smokeless tobacco: Never Used  . Alcohol Use: No  . Drug Use: No  . Sexual Activity: Not Asked   Other Topics Concern  . None   Social History Narrative    Outpatient Encounter Prescriptions as of 09/21/2015  Medication Sig  . amLODipine (NORVASC) 5 MG tablet Take 1 tablet (5 mg total) by mouth daily.  [name] aspirin 81 MG tablet Take 81 mg by mouth daily. Take 1 tablet by mouth once a day  . cetirizine (ZYRTEC) 10 MG tablet Take 10 mg by mouth daily as needed.   . Flaxseed Oil OIL by Does not apply route. 1000 mg  .  fluticasone (FLONASE) 50 MCG/ACT nasal spray Place 2 sprays into the nose daily.  [name] lisinopril (PRINIVIL,ZESTRIL) 10 MG tablet Take 1 tablet (10 mg total) by mouth daily.  [name] LORazepam (ATIVAN) 0.5 MG tablet Take 0.5 mg by mouth every 8 (eight) hours. 1 tablet as directed   No facility-administered encounter medications on file as of 09/21/2015.    Review of Systems  Constitutional: Negative for appetite change and unexpected weight change.  HENT: Negative for congestion and sinus pressure.   Eyes: Negative for pain and discharge.  Respiratory: Negative for cough, chest tightness and shortness of breath.   Cardiovascular: Negative for chest pain, palpitations and leg swelling.  Gastrointestinal: Negative for nausea, vomiting, abdominal pain and diarrhea.  Genitourinary: Negative for dysuria and difficulty urinating.  Musculoskeletal: Negative for back pain and joint swelling.  Skin: Negative for color change and rash.  Neurological: Negative for dizziness, light-headedness and headaches.  Psychiatric/Behavioral: Negative for dysphoric mood and agitation.       Objective:    Physical Exam  Constitutional: He appears well-developed and well-nourished. No distress.  HENT:  Mouth/Throat: Oropharynx is clear and moist.  Eyes: Conjunctivae are normal. Right eye exhibits no discharge. Left eye exhibits no discharge.  Neck: Neck supple. No thyromegaly present.  Cardiovascular: Normal rate and regular rhythm.   Pulmonary/Chest: Effort normal and breath sounds normal. No respiratory distress.  Abdominal: Soft. Bowel sounds are normal. There is no tenderness.  Musculoskeletal: He exhibits no edema or tenderness.  Lymphadenopathy:    He has no cervical adenopathy.  Skin: No rash noted. No erythema.  Psychiatric: He has a normal mood and affect. His behavior is normal.    BP 120/80 mmHg  Pulse 78  Temp(Src) 98.3 F (36.8 C) (Oral)  Resp 18  Ht 5' 8.5" (1.74 m)  Wt 201 lb (91.173 kg)   BMI 30.11 kg/m2  SpO2 97% Wt Readings from Last 3 Encounters:  09/21/15 201 lb (91.173 kg)  09/13/15 198 lb 4 oz (89.926 kg)  05/22/14 201 lb 8 oz (91.4 kg)     Lab Results  Component Value Date   WBC 5.5 09/13/2015   HGB 15.4 09/13/2015   HCT 45.8 09/13/2015   PLT 180.0 09/13/2015   GLUCOSE 156* 09/13/2015   CHOL 194 09/13/2015   TRIG 110.0 09/13/2015   HDL 33.70* 09/13/2015   LDLCALC 138* 09/13/2015   ALT 26 09/13/2015   AST 21 09/13/2015   NA 140 09/13/2015   K 4.1 09/13/2015   CL 104 09/13/2015   CREATININE 1.02 09/13/2015   BUN 15 09/13/2015   CO2 28 09/13/2015   TSH 0.89 09/13/2015   HGBA1C 7.6* 09/13/2015   MICROALBUR 1.0 09/13/2015        Assessment & Plan:   Problem List Items Addressed This Visit    Diabetes mellitus ([place])    Discussed at length with mr Jackson Cooper and his wife today.  Discussed diet and exercise.  Follow met b and a1c.  He declines medication.  Declines lifestyles.        Essential hypertension, benign - Primary    Blood pressure doing better on lisinopril.  Continue.  Check metabolic panel within the next week.  Follow pressures.        Hypercholesterolemia    Low cholesterol diet and exercise.  Discussed cholesterol medication.  He declines.  Wants to work on diet and exercise.  Follow lipid panel.        Stress    Increased stress with his job.  Discussed today.  Follow.          I spent 25 minutes with the patient and more than 50% of the time was spent in consultation regarding the above.     Einar Pheasant, MD

## 2015-09-26 ENCOUNTER — Encounter: Payer: Self-pay | Admitting: Internal Medicine

## 2015-09-26 NOTE — Assessment & Plan Note (Signed)
Increased stress with his job.  Discussed today.  Follow.

## 2015-09-26 NOTE — Assessment & Plan Note (Signed)
Blood pressure doing better on lisinopril.  Continue.  Check metabolic panel within the next week.  Follow pressures.

## 2015-09-26 NOTE — Assessment & Plan Note (Signed)
Discussed at length with mr Quevedo and his wife today.  Discussed diet and exercise.  Follow met b and a1c.  He declines medication.  Declines lifestyles.

## 2015-09-26 NOTE — Assessment & Plan Note (Signed)
Low cholesterol diet and exercise.  Discussed cholesterol medication.  He declines.  Wants to work on diet and exercise.  Follow lipid panel.

## 2015-09-29 ENCOUNTER — Other Ambulatory Visit (INDEPENDENT_AMBULATORY_CARE_PROVIDER_SITE_OTHER): Payer: BC Managed Care – PPO

## 2015-09-29 ENCOUNTER — Encounter: Payer: Self-pay | Admitting: Internal Medicine

## 2015-09-29 DIAGNOSIS — I10 Essential (primary) hypertension: Secondary | ICD-10-CM | POA: Diagnosis not present

## 2015-09-29 LAB — BASIC METABOLIC PANEL
BUN: 14 mg/dL (ref 6–23)
CO2: 30 mEq/L (ref 19–32)
Calcium: 9.6 mg/dL (ref 8.4–10.5)
Chloride: 105 mEq/L (ref 96–112)
Creatinine, Ser: 1.02 mg/dL (ref 0.40–1.50)
GFR: 81.79 mL/min (ref >60.00)
Glucose, Bld: 153 mg/dL — ABNORMAL HIGH (ref 70–99)
Potassium: 3.9 mEq/L (ref 3.5–5.1)
Sodium: 140 mEq/L (ref 135–145)

## 2015-09-30 ENCOUNTER — Other Ambulatory Visit: Payer: Self-pay | Admitting: Internal Medicine

## 2015-09-30 DIAGNOSIS — Z125 Encounter for screening for malignant neoplasm of prostate: Secondary | ICD-10-CM

## 2015-09-30 NOTE — Progress Notes (Signed)
Order placed for psa add on lab

## 2015-10-01 ENCOUNTER — Other Ambulatory Visit: Payer: Self-pay | Admitting: Internal Medicine

## 2015-10-01 DIAGNOSIS — Z125 Encounter for screening for malignant neoplasm of prostate: Secondary | ICD-10-CM

## 2015-10-01 NOTE — Progress Notes (Signed)
Order placed for future psa

## 2015-10-12 ENCOUNTER — Ambulatory Visit: Payer: BC Managed Care – PPO | Admitting: Internal Medicine

## 2015-11-06 ENCOUNTER — Other Ambulatory Visit: Payer: Self-pay | Admitting: Internal Medicine

## 2015-12-20 ENCOUNTER — Other Ambulatory Visit: Payer: Self-pay | Admitting: Internal Medicine

## 2016-03-20 ENCOUNTER — Telehealth: Payer: Self-pay | Admitting: Internal Medicine

## 2016-03-20 DIAGNOSIS — Z125 Encounter for screening for malignant neoplasm of prostate: Secondary | ICD-10-CM

## 2016-03-20 DIAGNOSIS — E7800 Pure hypercholesterolemia, unspecified: Secondary | ICD-10-CM

## 2016-03-20 DIAGNOSIS — I10 Essential (primary) hypertension: Secondary | ICD-10-CM

## 2016-03-20 DIAGNOSIS — E119 Type 2 diabetes mellitus without complications: Secondary | ICD-10-CM

## 2016-03-20 NOTE — Telephone Encounter (Signed)
Pt wife would like for pt to have lab work and PSA done. Wife would like before Physical in 05/2016 to have results then. Need orders please and thank you. Call wife @ [phone]. Thank you!

## 2016-03-21 NOTE — Telephone Encounter (Signed)
I have placed the order for the labs.  He needs a fasting lab appt scheduled within the next 1-2 weeks.  Overdue.  Thanks

## 2016-04-04 ENCOUNTER — Other Ambulatory Visit (INDEPENDENT_AMBULATORY_CARE_PROVIDER_SITE_OTHER): Payer: BC Managed Care – PPO

## 2016-04-04 DIAGNOSIS — Z125 Encounter for screening for malignant neoplasm of prostate: Secondary | ICD-10-CM

## 2016-04-04 DIAGNOSIS — E119 Type 2 diabetes mellitus without complications: Secondary | ICD-10-CM

## 2016-04-04 DIAGNOSIS — E7800 Pure hypercholesterolemia, unspecified: Secondary | ICD-10-CM

## 2016-04-12 ENCOUNTER — Encounter: Payer: Self-pay | Admitting: Internal Medicine

## 2016-04-12 ENCOUNTER — Other Ambulatory Visit (INDEPENDENT_AMBULATORY_CARE_PROVIDER_SITE_OTHER): Payer: BC Managed Care – PPO

## 2016-04-12 ENCOUNTER — Ambulatory Visit (INDEPENDENT_AMBULATORY_CARE_PROVIDER_SITE_OTHER): Payer: BC Managed Care – PPO | Admitting: Internal Medicine

## 2016-04-12 VITALS — BP 120/78 | HR 76 | Temp 98.2°F | Resp 18 | Ht 68.5 in | Wt 200.0 lb

## 2016-04-12 DIAGNOSIS — E119 Type 2 diabetes mellitus without complications: Secondary | ICD-10-CM

## 2016-04-12 DIAGNOSIS — Z0000 Encounter for general adult medical examination without abnormal findings: Secondary | ICD-10-CM

## 2016-04-12 DIAGNOSIS — I10 Essential (primary) hypertension: Secondary | ICD-10-CM

## 2016-04-12 DIAGNOSIS — F439 Reaction to severe stress, unspecified: Secondary | ICD-10-CM

## 2016-04-12 DIAGNOSIS — E7800 Pure hypercholesterolemia, unspecified: Secondary | ICD-10-CM | POA: Diagnosis not present

## 2016-04-12 DIAGNOSIS — Z658 Other specified problems related to psychosocial circumstances: Secondary | ICD-10-CM | POA: Diagnosis not present

## 2016-04-12 LAB — HEMOGLOBIN A1C: HEMOGLOBIN A1C: 5.9 % (ref 4.6–6.5)

## 2016-04-12 LAB — BASIC METABOLIC PANEL
BUN: 16 mg/dL (ref 6–23)
CO2: 25 mEq/L (ref 19–32)
Calcium: 9.3 mg/dL (ref 8.4–10.5)
Chloride: 103 mEq/L (ref 96–112)
Creatinine, Ser: 0.98 mg/dL (ref 0.40–1.50)
GFR: 85.47 mL/min (ref >60.00)
Glucose, Bld: 133 mg/dL — ABNORMAL HIGH (ref 70–99)
POTASSIUM: 4.3 meq/L (ref 3.5–5.1)
SODIUM: 137 meq/L (ref 135–145)

## 2016-04-12 LAB — LIPID PANEL
CHOL/HDL RATIO: 6
Cholesterol: 188 mg/dL (ref 0–200)
HDL: 30.8 mg/dL — AB (ref >39.00)
LDL CALC: 133 mg/dL — AB (ref 0–99)
NonHDL: 157.65
Triglycerides: 124 mg/dL (ref 0.0–149.0)
VLDL: 24.8 mg/dL (ref 0.0–40.0)

## 2016-04-12 LAB — HEPATIC FUNCTION PANEL
ALBUMIN: 4.5 g/dL (ref 3.5–5.2)
ALT: 20 U/L (ref 0–53)
AST: 14 U/L (ref 0–37)
Alkaline Phosphatase: 50 U/L (ref 39–117)
Bilirubin, Direct: 0.1 mg/dL (ref 0.0–0.3)
Total Bilirubin: 0.7 mg/dL (ref 0.2–1.2)
Total Protein: 6.6 g/dL (ref 6.0–8.3)

## 2016-04-12 LAB — PSA: PSA: 0.67 ng/mL (ref 0.10–4.00)

## 2016-04-12 MED ORDER — AMLODIPINE BESYLATE 5 MG PO TABS
5.0000 mg | ORAL_TABLET | Freq: Every day | ORAL | Status: DC
Start: 2016-04-12 — End: 2016-10-10

## 2016-04-12 MED ORDER — LISINOPRIL 10 MG PO TABS: 10.0000 mg | ORAL_TABLET | Freq: Every day | ORAL | Status: DC

## 2016-04-12 NOTE — Progress Notes (Signed)
Pre-visit discussion using our clinic review tool. No additional management support is needed unless otherwise documented below in the visit note.  

## 2016-04-12 NOTE — Progress Notes (Signed)
Patient ID: Jackson Cooper, male   DOB: 1966/06/30, 50 y.o.   MRN: [id number]   Subjective:    Patient ID: Jackson Cooper, male    DOB: Oct 26, 1966, 50 y.o.   MRN: [id number]  HPI  Patient here for a scheduled follow up.  He is accompanied by his wife.  History obtained from both of them.  He feels he is doing well.  Feels good.  Staying active.  No cardiac symptoms with increased activity or exertion.  No sob.  No acid reflux.  No abdominal pain or cramping.  Bowels stable.  No blood.  Not checking his sugars.  Has adjusted his diet.  Feels good.    Past Medical History  Diagnosis Date  . Enlarged prostate   . Encephalitis 1970  . DVT (deep venous thrombosis) ([place]) Diagnosed 9/08  . Nephrolithiasis   . Hyperglycemia    Past Surgical History  Procedure Laterality Date  . Eye surgery  1972   Family History  Problem Relation Age of Onset  . Hypertension Mother   . Breast cancer Sister    Social History   Social History  . Marital Status: Married    Spouse Name: N/A  . Number of Children: N/A  . Years of Education: N/A   Social History Main Topics  . Smoking status: Never Smoker   . Smokeless tobacco: Never Used  . Alcohol Use: No  . Drug Use: No  . Sexual Activity: Not Asked   Other Topics Concern  . None   Social History Narrative    Outpatient Encounter Prescriptions as of 04/12/2016  Medication Sig  . amLODipine (NORVASC) 5 MG tablet Take 1 tablet (5 mg total) by mouth daily.  [name] aspirin 81 MG tablet Take 81 mg by mouth daily. Take 1 tablet by mouth once a day  . cetirizine (ZYRTEC) 10 MG tablet Take 10 mg by mouth daily as needed.   . Flaxseed Oil OIL by Does not apply route. 1000 mg  . fluticasone (FLONASE) 50 MCG/ACT nasal spray Place 2 sprays into the nose daily.  [name] lisinopril (PRINIVIL,ZESTRIL) 10 MG tablet Take 1 tablet (10 mg total) by mouth daily.  [name] LORazepam (ATIVAN) 0.5 MG tablet Take 0.5 mg by mouth every 8 (eight) hours. 1 tablet as directed  .  [DISCONTINUED] amLODipine (NORVASC) 5 MG tablet TAKE ONE TABLET BY MOUTH ONCE DAILY  . [DISCONTINUED] lisinopril (PRINIVIL,ZESTRIL) 10 MG tablet TAKE ONE TABLET BY MOUTH ONCE DAILY   No facility-administered encounter medications on file as of 04/12/2016.    Review of Systems  Constitutional: Negative for appetite change and unexpected weight change.  HENT: Negative for congestion and sinus pressure.   Respiratory: Negative for cough, chest tightness and shortness of breath.   Cardiovascular: Negative for chest pain, palpitations and leg swelling.  Gastrointestinal: Negative for nausea, vomiting, abdominal pain and diarrhea.  Genitourinary: Negative for dysuria and difficulty urinating.  Musculoskeletal: Negative for back pain and joint swelling.  Skin: Negative for color change and rash.  Neurological: Negative for dizziness, light-headedness and headaches.  Psychiatric/Behavioral: Negative for dysphoric mood and agitation.       Objective:     Blood pressure rechecked by me:  120/78  Physical Exam  Constitutional: He appears well-developed and well-nourished. No distress.  HENT:  Mouth/Throat: Oropharynx is clear and moist.  Neck: Neck supple. No thyromegaly present.  Cardiovascular: Normal rate and regular rhythm.   Pulmonary/Chest: Effort normal and breath sounds normal. No respiratory distress.  Abdominal: Soft. Bowel sounds are normal. There is no tenderness.  Musculoskeletal: He exhibits no edema or tenderness.  Skin: No rash noted. No erythema.  Psychiatric: He has a normal mood and affect. His behavior is normal.    BP 120/78 mmHg  Pulse 76  Temp(Src) 98.2 F (36.8 C) (Oral)  Resp 18  Ht 5' 8.5" (1.74 m)  Wt 200 lb (90.719 kg)  BMI 29.96 kg/m2  SpO2 96% Wt Readings from Last 3 Encounters:  04/12/16 200 lb (90.719 kg)  09/21/15 201 lb (91.173 kg)  09/13/15 198 lb 4 oz (89.926 kg)     Lab Results  Component Value Date   WBC 5.5 09/13/2015   HGB 15.4  09/13/2015   HCT 45.8 09/13/2015   PLT 180.0 09/13/2015   GLUCOSE 133* 04/12/2016   CHOL 188 04/12/2016   TRIG 124.0 04/12/2016   HDL 30.80* 04/12/2016   LDLCALC 133* 04/12/2016   ALT 20 04/12/2016   AST 14 04/12/2016   NA 137 04/12/2016   K 4.3 04/12/2016   CL 103 04/12/2016   CREATININE 0.98 04/12/2016   BUN 16 04/12/2016   CO2 25 04/12/2016   TSH 0.89 09/13/2015   PSA 0.67 04/12/2016   HGBA1C 5.9 04/12/2016   MICROALBUR 1.0 09/13/2015        Assessment & Plan:   Problem List Items Addressed This Visit    Diabetes mellitus ([place]) - Primary    Does not check sugars.  Has adjusted diet.  Low carb diet and exercise.  Follow met b and a1c.       Relevant Medications   lisinopril (PRINIVIL,ZESTRIL) 10 MG tablet   Essential hypertension, benign    Blood pressure under good control.  Continue same medication regimen.  Follow pressures.  Follow metabolic panel.        Relevant Medications   amLODipine (NORVASC) 5 MG tablet   lisinopril (PRINIVIL,ZESTRIL) 10 MG tablet   Hypercholesterolemia    Desires not to take cholesterol medication.  Low cholesterol diet and exercise.  Follow lipid panel and liver function tests.        Relevant Medications   amLODipine (NORVASC) 5 MG tablet   lisinopril (PRINIVIL,ZESTRIL) 10 MG tablet   Stress    Overall feels he is handling stress relatively well.  Follow.            Einar Pheasant, MD

## 2016-04-24 ENCOUNTER — Encounter: Payer: Self-pay | Admitting: Internal Medicine

## 2016-04-24 NOTE — Assessment & Plan Note (Signed)
Does not check sugars.  Has adjusted diet.  Low carb diet and exercise.  Follow met b and a1c.

## 2016-04-24 NOTE — Assessment & Plan Note (Signed)
Overall feels he is handling stress relatively well.  Follow.

## 2016-04-24 NOTE — Assessment & Plan Note (Signed)
Desires not to take cholesterol medication.  Low cholesterol diet and exercise.  Follow lipid panel and liver function tests.

## 2016-04-24 NOTE — Assessment & Plan Note (Signed)
Blood pressure under good control.  Continue same medication regimen.  Follow pressures.  Follow metabolic panel.   

## 2016-06-26 ENCOUNTER — Encounter: Payer: Self-pay | Admitting: Internal Medicine

## 2016-06-26 ENCOUNTER — Encounter (INDEPENDENT_AMBULATORY_CARE_PROVIDER_SITE_OTHER): Payer: Self-pay

## 2016-06-26 ENCOUNTER — Ambulatory Visit (INDEPENDENT_AMBULATORY_CARE_PROVIDER_SITE_OTHER): Payer: BC Managed Care – PPO | Admitting: Internal Medicine

## 2016-06-26 VITALS — BP 100/70 | HR 69 | Temp 98.2°F | Resp 17 | Ht 68.0 in | Wt 197.2 lb

## 2016-06-26 DIAGNOSIS — E119 Type 2 diabetes mellitus without complications: Secondary | ICD-10-CM | POA: Diagnosis not present

## 2016-06-26 DIAGNOSIS — Z86718 Personal history of other venous thrombosis and embolism: Secondary | ICD-10-CM

## 2016-06-26 DIAGNOSIS — Z0001 Encounter for general adult medical examination with abnormal findings: Secondary | ICD-10-CM

## 2016-06-26 DIAGNOSIS — E7800 Pure hypercholesterolemia, unspecified: Secondary | ICD-10-CM | POA: Diagnosis not present

## 2016-06-26 DIAGNOSIS — R413 Other amnesia: Secondary | ICD-10-CM

## 2016-06-26 DIAGNOSIS — M545 Low back pain, unspecified: Secondary | ICD-10-CM

## 2016-06-26 DIAGNOSIS — I10 Essential (primary) hypertension: Secondary | ICD-10-CM | POA: Diagnosis not present

## 2016-06-26 DIAGNOSIS — R05 Cough: Secondary | ICD-10-CM

## 2016-06-26 DIAGNOSIS — Z0000 Encounter for general adult medical examination without abnormal findings: Secondary | ICD-10-CM

## 2016-06-26 MED ORDER — LOSARTAN POTASSIUM 25 MG PO TABS: 25.0000 mg | ORAL_TABLET | Freq: Every day | ORAL | 3 refills | Status: DC

## 2016-06-26 NOTE — Progress Notes (Signed)
Pre-visit discussion using our clinic review tool. No additional management support is needed unless otherwise documented below in the visit note.  

## 2016-06-26 NOTE — Progress Notes (Signed)
Patient ID: Jackson Cooper, male   DOB: 09/28/1966, 50 y.o.   MRN: [id number]   Subjective:    Patient ID: Jackson Cooper, male    DOB: 09-12-1966, 50 y.o.   MRN: [id number]  HPI  Patient here for his physical exam.  He is accompanied by his wife.  History obtained from both of them.  He reports he has noticed a persistent dry cough.  No congestion currently.  Was questioning if could be related to the lisinopril.  Does have some allergy issues, but this is mostly in the fall.  Is off zyrtec.  Feels better off.  Was questioning another antihistamine to use.  Discussed a trial of allegra.  He has also noticed some occasional discomfort in his left lower back.  This only occurs when he is driving.  Had adjusted the seat.  Occurs in more than one vehicle.  Does not occur when he is the passenger and does not occur at other times.  Discussed removing his wallet.  Discussed added support.  Discussed further w/up.  He declines.  He also reports a remote head injury.  Was evaluated by neurosurgery at that time.  States since then, has noticed having trouble remembering.  Feels is gradually worsening, where now he feels is affecting his job.  Trouble remembering names and conveying his thoughts.  Harder for him to explain things.  Some changes in his memory over time.  No focal motor deficits.  Eating and drinking well.  No nausea or vomiting.  Bowels stable.     Past Medical History:  Diagnosis Date  . DVT (deep venous thrombosis) ([place]) Diagnosed 9/08  . Encephalitis 1970  . Enlarged prostate   . Hyperglycemia   . Nephrolithiasis    Past Surgical History:  Procedure Laterality Date  . EYE SURGERY  1972   Family History  Problem Relation Age of Onset  . Hypertension Mother   . Breast cancer Sister    Social History   Social History  . Marital status: Married    Spouse name: N/A  . Number of children: N/A  . Years of education: N/A   Social History Main Topics  . Smoking status: Never Smoker   . Smokeless tobacco: Never Used  . Alcohol use No  . Drug use: No  . Sexual activity: Not Asked   Other Topics Concern  . None   Social History Narrative  . None    Outpatient Encounter Prescriptions as of 06/26/2016  Medication Sig  . amLODipine (NORVASC) 5 MG tablet Take 1 tablet (5 mg total) by mouth daily.  [name] aspirin 81 MG tablet Take 81 mg by mouth daily. Take 1 tablet by mouth once a day  . Flaxseed Oil OIL by Does not apply route. 1000 mg  . fluticasone (FLONASE) 50 MCG/ACT nasal spray Place 2 sprays into the nose daily.  [name] LORazepam (ATIVAN) 0.5 MG tablet Take 0.5 mg by mouth every 8 (eight) hours. 1 tablet as directed  . [DISCONTINUED] lisinopril (PRINIVIL,ZESTRIL) 10 MG tablet Take 1 tablet (10 mg total) by mouth daily.  . cetirizine (ZYRTEC) 10 MG tablet Take 10 mg by mouth daily as needed.   [name] losartan (COZAAR) 25 MG tablet Take 1 tablet (25 mg total) by mouth daily.   No facility-administered encounter medications on file as of 06/26/2016.     Review of Systems  Constitutional: Negative for appetite change, fatigue and unexpected weight change.  HENT: Negative for congestion and sinus pressure.  Has allergy issues as outlined.  Better now.    Eyes: Negative for pain and visual disturbance.  Respiratory: Positive for cough. Negative for chest tightness and shortness of breath.   Cardiovascular: Negative for chest pain, palpitations and leg swelling.  Gastrointestinal: Negative for abdominal pain, diarrhea, nausea and vomiting.  Genitourinary: Negative for difficulty urinating and dysuria.  Musculoskeletal: Positive for back pain. Negative for joint swelling.  Skin: Negative for color change and rash.  Neurological: Negative for dizziness, light-headedness and headaches.       Trouble with memory and expressing self or explaining things - as outlined.    Hematological: Negative for adenopathy. Does not bruise/bleed easily.  Psychiatric/Behavioral: Negative for  agitation and dysphoric mood.       Objective:     Blood pressure rechecked by me:  116/72  Physical Exam  Constitutional: He is oriented to person, place, and time. He appears well-developed and well-nourished. No distress.  HENT:  Head: Normocephalic and atraumatic.  Nose: Nose normal.  Mouth/Throat: Oropharynx is clear and moist. No oropharyngeal exudate.  Eyes: Conjunctivae are normal. Right eye exhibits no discharge. Left eye exhibits no discharge.  Neck: Neck supple. No thyromegaly present.  Cardiovascular: Regular rhythm.   Pulmonary/Chest: Breath sounds normal. No respiratory distress. He has no wheezes.  Abdominal: Soft. Bowel sounds are normal. There is no tenderness.  Genitourinary:  Genitourinary Comments: Rectal exam - no palpable prostate nodules.  Heme negative.    Musculoskeletal: He exhibits no edema or tenderness.  Lymphadenopathy:    He has no cervical adenopathy.  Neurological: He is alert and oriented to person, place, and time.  Skin: Skin is warm and dry. No rash noted. No erythema.  Psychiatric: He has a normal mood and affect. His behavior is normal.    BP 100/70 (BP Location: Left Arm, Patient Position: Sitting, Cuff Size: Large)   Pulse 69   Temp 98.2 F (36.8 C) (Oral)   Resp 17   Ht _0  (1.727 m)   Wt 197 lb 4 oz (89.5 kg)   SpO2 94%   BMI 29.99 kg/m  Wt Readings from Last 3 Encounters:  06/26/16 197 lb 4 oz (89.5 kg)  04/12/16 200 lb (90.7 kg)  09/21/15 201 lb (91.2 kg)     Lab Results  Component Value Date   WBC 5.5 09/13/2015   HGB 15.4 09/13/2015   HCT 45.8 09/13/2015   PLT 180.0 09/13/2015   GLUCOSE 133 (H) 04/12/2016   CHOL 188 04/12/2016   TRIG 124.0 04/12/2016   HDL 30.80 (L) 04/12/2016   LDLCALC 133 (H) 04/12/2016   ALT 20 04/12/2016   AST 14 04/12/2016   NA 137 04/12/2016   K 4.3 04/12/2016   CL 103 04/12/2016   CREATININE 0.98 04/12/2016   BUN 16 04/12/2016   CO2 25 04/12/2016   TSH 0.89 09/13/2015   PSA 0.67  04/12/2016   HGBA1C 5.9 04/12/2016   MICROALBUR 1.0 09/13/2015    Dg Chest 2 View  Result Date: 02/17/2013 *RADIOLOGY REPORT* Clinical Data: Screening for pulmonary tuberculosis. CHEST - 2 VIEW Comparison: None. Findings: Two views of the chest demonstrate clear lungs. Heart and mediastinum are within normal limits.  The trachea is midline. Bony thorax is intact. IMPRESSION: Normal chest examination. Original Report Authenticated By: Markus Daft, M.D.       Assessment & Plan:   Problem List Items Addressed This Visit    Diabetes mellitus ([place])    Last a1c improved.  Continue to  monitor low carb diet and exercise.  Follow met b and a1c.        Relevant Medications   losartan (COZAAR) 25 MG tablet   Other Relevant Orders   Hemoglobin L5Y   Basic metabolic panel   Essential hypertension, benign    Blood pressure has been well controlled.  Has the cough now.  Dry cough.  Discussed could be related to lisinopril.  Will stop lisinopril.  Start losartan 78m q day.  Follow metabolic panel.  Follow blood pressure.        Relevant Medications   losartan (COZAAR) 25 MG tablet   Other Relevant Orders   CBC with Differential/Platelet   TSH   History of DVT (deep vein thrombosis)    Off coumadin.  No reoccurrence.  Follow.       Hypercholesterolemia    Desires not to take cholesterol medication.  Low cholesterol diet and exercise.  Follow lipid panel.        Relevant Medications   losartan (COZAAR) 25 MG tablet   Other Relevant Orders   Lipid panel   Hepatic function panel   Memory changes    Has a remote head injury.  Has noticed progressive problems with his memory.  Problems remembering names.  Problems with expressing himself at times.  Problems with explaining things.  No focal motor deficits.  No acute changes.  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  Will check MRI brain.        Relevant Orders   MR Brain W Wo Contrast    Other Visit Diagnoses    Routine general  medical examination at a health care facility    -  Primary   Left-sided low back pain without sciatica       only occures when driving.  discussed removing wallet and adding support.  wants to hold on any further evaluation , i.e., xray.  follow.         SEinar Pheasant MD

## 2016-06-27 ENCOUNTER — Encounter: Payer: Self-pay | Admitting: Internal Medicine

## 2016-06-27 DIAGNOSIS — R413 Other amnesia: Secondary | ICD-10-CM | POA: Insufficient documentation

## 2016-06-27 NOTE — Assessment & Plan Note (Signed)
Desires not to take cholesterol medication.  Low cholesterol diet and exercise.  Follow lipid panel.   

## 2016-06-27 NOTE — Assessment & Plan Note (Signed)
Has a remote head injury.  Has noticed progressive problems with his memory.  Problems remembering names.  Problems with expressing himself at times.  Problems with explaining things.  No focal motor deficits.  No acute changes.  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  Will check MRI brain.

## 2016-06-27 NOTE — Assessment & Plan Note (Signed)
Blood pressure has been well controlled.  Has the cough now.  Dry cough.  Discussed could be related to lisinopril.  Will stop lisinopril.  Start losartan 25mg  q day.  Follow metabolic panel.  Follow blood pressure.

## 2016-06-27 NOTE — Assessment & Plan Note (Signed)
Last a1c improved.  Continue to monitor low carb diet and exercise.  Follow met b and a1c.

## 2016-06-27 NOTE — Assessment & Plan Note (Signed)
Off coumadin.  No reoccurrence.  Follow.

## 2016-08-30 ENCOUNTER — Ambulatory Visit (INDEPENDENT_AMBULATORY_CARE_PROVIDER_SITE_OTHER): Payer: BC Managed Care – PPO | Admitting: Family

## 2016-08-30 ENCOUNTER — Encounter: Payer: Self-pay | Admitting: Family

## 2016-08-30 VITALS — BP 118/70 | HR 70 | Temp 98.4°F

## 2016-08-30 DIAGNOSIS — R109 Unspecified abdominal pain: Secondary | ICD-10-CM | POA: Diagnosis not present

## 2016-08-30 DIAGNOSIS — G8929 Other chronic pain: Secondary | ICD-10-CM

## 2016-08-30 DIAGNOSIS — M5442 Lumbago with sciatica, left side: Secondary | ICD-10-CM | POA: Diagnosis not present

## 2016-08-30 DIAGNOSIS — M545 Low back pain, unspecified: Secondary | ICD-10-CM | POA: Insufficient documentation

## 2016-08-30 LAB — POCT URINALYSIS DIPSTICK
Bilirubin, UA: NEGATIVE
Glucose, UA: NEGATIVE
KETONES UA: NEGATIVE
LEUKOCYTES UA: NEGATIVE
Nitrite, UA: NEGATIVE
PH UA: 5
PROTEIN UA: NEGATIVE
RBC UA: NEGATIVE
Spec Grav, UA: 1.02
Urobilinogen, UA: 0.2

## 2016-08-30 MED ORDER — PREDNISONE 10 MG PO TABS: ORAL_TABLET | ORAL | 0 refills | Status: DC

## 2016-08-30 NOTE — Progress Notes (Signed)
Pre visit review using our clinic review tool, if applicable. No additional management support is needed unless otherwise documented below in the visit note. 

## 2016-08-30 NOTE — Progress Notes (Signed)
Results will be mailed.

## 2016-08-30 NOTE — Assessment & Plan Note (Addendum)
Symptoms consistent with sciatica of left side. UA negative for nitrites, leukocytes, blood. No CVA tenderness. Patient does have atypical symptom of left abdominal side fullness as he describes. reassured by normal abdominal exam and was unable to appreciate asymmetry in the abdomen or any masses. However we are pending ultrasound complete abdomen for further evaluation. Pending  MR lumbar spine to evaluate for any nerve impingement. Trial of oral prednisone.recommendations of heat and exercises given to patient.

## 2016-08-30 NOTE — Progress Notes (Signed)
Subjective:    Patient ID: Jackson DouglasRicky L Boley, male    DOB: [birth year], 50 y.o.   MRN: [id number]  CC: Jackson DouglasRicky L Snellgrove is a 50 y.o. male who presents today for an acute visit.    HPI: Patient presents for acute visit with chief complaint of left flank pain for past several months to one , waxing and waniong. Constant ache. [facility] which makes pain worse. Initially noted it after long car rides which [place]; tried to switch wallet to other side which didn't help. Has tried Aleve and Tylenol and ibuprofen with mild relief.   Left low back tightness, itching, stiffness. Can get out of car and 'walk it out.' Describes 'fullness' in left quadrant of abdomen. Worse after sitting.    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 However note pain started prior.   No leg pain, numbness, tingling , weakness. Bowel movements normal. No dysuria.    [facility]. At that time had blood in stool. Due for colonoscopy now.     HISTORY:  Past Medical History:  Diagnosis Date  . DVT (deep venous thrombosis) ([facility]) Diagnosed 9/08  . Encephalitis 1970  . Enlarged prostate   . Hyperglycemia   . Nephrolithiasis    Past Surgical History:  Procedure Laterality Date  . EYE SURGERY  1972   Family History  Problem Relation Age of Onset  . Hypertension Mother   . Breast cancer Sister     Allergies: Review of patient's allergies indicates no known allergies. Current Outpatient Prescriptions on File Prior to Visit  Medication Sig Dispense Refill  . amLODipine (NORVASC) 5 MG tablet Take 1 tablet (5 mg total) by mouth daily. 90 tablet 1  . aspirin 81 MG tablet Take 81 mg by mouth daily. Take 1 tablet by mouth once a day    . cetirizine (ZYRTEC) 10 MG tablet Take 10 mg by mouth daily as needed.     . Flaxseed Oil OIL by Does not apply route. 1000 mg    . fluticasone (FLONASE) 50 MCG/ACT nasal  spray Place 2 sprays into the nose daily. 16 g 2  . LORazepam (ATIVAN) 0.5 MG tablet Take 0.5 mg by mouth every 8 (eight) hours. 1 tablet as directed    . losartan (COZAAR) 25 MG tablet Take 1 tablet (25 mg total) by mouth daily. 30 tablet 3   No current facility-administered medications on file prior to visit.     Social History  Substance Use Topics  . Smoking status: Never Smoker  . Smokeless tobacco: Never Used  . Alcohol use No    Review of Systems  Constitutional: Negative for chills and fever.  Respiratory: Negative for cough.   Cardiovascular: Negative for chest pain and palpitations.  Gastrointestinal: Positive for abdominal distention. Negative for constipation, diarrhea, nausea and vomiting.  Genitourinary: Positive for flank pain. Negative for dysuria and frequency.  Musculoskeletal: Positive for back pain. Negative for arthralgias and joint swelling.  Skin: Negative for rash.      Objective:    BP 118/70   Pulse 70   Temp 98.4 F (36.9 C) (Oral)   SpO2 97%    Physical Exam  Constitutional: He appears well-developed and well-nourished.  Cardiovascular: Regular rhythm and normal heart sounds.   Pulmonary/Chest: Effort normal and breath sounds normal. No respiratory distress. He has no wheezes. He has no  rales.  Abdominal: Soft. Normal appearance and bowel sounds are normal. He exhibits no distension, no fluid wave, no ascites and no mass. There is no tenderness. There is no rigidity, no rebound, no guarding, no CVA tenderness, no tenderness at McBurney's point and negative Murphy's sign.  Musculoskeletal:       Lumbar back: He exhibits normal range of motion, no tenderness, no swelling, no pain and no spasm.  Full range of motion with flexion, extension, lateral side bends. No pain, numbness, tingling elicited with single leg raise bilaterally. No rash.  Neurological: He is alert.  Reflex Scores:      Achilles reflexes are 2+ on the right side and 2+ on the left  side. Sensation in lower extremity intact. BLE strength normal, 5/5.  Skin: Skin is warm and dry.  Psychiatric: He has a normal mood and affect. His speech is normal and behavior is normal.  Vitals reviewed.      Assessment & Plan:   Problem List Items Addressed This Visit      Other   Low back pain - Primary    Symptoms consistent with sciatica of left side. UA negative for nitrites, leukocytes, blood. No CVA tenderness. Patient does have atypical symptom of left abdominal side fullness as he describes. reassured by normal abdominal exam and was unable to appreciate asymmetry in the abdomen or any masses. However we are pending ultrasound complete abdomen for further evaluation. Pending  MR lumbar spine to evaluate for any nerve impingement. Trial of oral prednisone.recommendations of heat and exercises given to patient.      Relevant Medications   predniSONE (DELTASONE) 10 MG tablet   Other Relevant Orders   POCT urinalysis dipstick (Completed)   MR Lumbar Spine Wo Contrast    Other Visit Diagnoses    Left sided abdominal pain       Relevant Orders   Ambulatory referral to Gastroenterology   US Abdomen Complete        I am having Mr. Kupper start on predniSONE. I am also having him maintain his aspirin, LORazepam, Flaxseed Oil, cetirizine, fluticasone, amLODipine, and losartan.   Meds ordered this encounter  Medications  . predniSONE (DELTASONE) 10 MG tablet    Sig: Take 4 tablets ( total 40 mg) by mouth for 2 days; take 3 tablets ( total 30 mg) by mouth for 2 days; take 2 tablets (total 20mg ) by mouth for 2 days; then take 1 tablet ( total 10mg ) by mouth for 2 days.    Dispense:  20 tablet    Refill:  0    Order Specific Question:   Supervising Provider    Answer:   Sherlene Shams [[id number]]    Return precautions given.   Risks, benefits, and alternatives of the medications and treatment plan prescribed today were discussed, and patient expressed understanding.    Education regarding symptom management and diagnosis given to patient on AVS.  Continue to follow with Dale Atglen, MD for routine health maintenance.   Jackson Cooper and I agreed with plan.   Rennie Plowman, FNP

## 2016-08-30 NOTE — Patient Instructions (Signed)
Suspect nerve impingement.  Trial of prednisone.   Heat.  If there is no improvement in your symptoms, or if there is any worsening of symptoms, or if you have any additional concerns, please return for re-evaluation; or, if we are closed, consider going to the Emergency Room for evaluation if symptoms urgent.   Sciatica With Rehab The sciatic nerve runs from the back down the leg and is responsible for sensation and control of the muscles in the back (posterior) side of the thigh, lower leg, and foot. Sciatica is a condition that is characterized by inflammation of this nerve.  SYMPTOMS   Signs of nerve damage, including numbness and/or weakness along the posterior side of the lower extremity.  Pain in the back of the thigh that may also travel down the leg.  Pain that worsens when sitting for long periods of time.  Occasionally, pain in the back or buttock. CAUSES  Inflammation of the sciatic nerve is the cause of sciatica. The inflammation is due to something irritating the nerve. Common sources of irritation include:  Sitting for long periods of time.  Direct trauma to the nerve.  Arthritis of the spine.  Herniated or ruptured disk.  Slipping of the vertebrae (spondylolisthesis).  Pressure from soft tissues, such as muscles or ligament-like tissue (fascia). RISK INCREASES WITH:  Sports that place pressure or stress on the spine (football or weightlifting).  Poor strength and flexibility.  Failure to warm up properly before activity.  Family history of low back pain or disk disorders.  Previous back injury or surgery.  Poor body mechanics, especially when lifting, or poor posture. PREVENTION   Warm up and stretch properly before activity.  Maintain physical fitness:  Strength, flexibility, and endurance.  Cardiovascular fitness.  Learn and use proper technique, especially with posture and lifting. When possible, have coach correct improper  technique.  Avoid activities that place stress on the spine. PROGNOSIS If treated properly, then sciatica usually resolves within 6 weeks. However, occasionally surgery is necessary.  RELATED COMPLICATIONS   Permanent nerve damage, including pain, numbness, tingle, or weakness.  Chronic back pain.  Risks of surgery: infection, bleeding, nerve damage, or damage to surrounding tissues. TREATMENT Treatment initially involves resting from any activities that aggravate your symptoms. The use of ice and medication may help reduce pain and inflammation. The use of strengthening and stretching exercises may help reduce pain with activity. These exercises may be performed at home or with referral to a therapist. A therapist may recommend further treatments, such as transcutaneous electronic nerve stimulation (TENS) or ultrasound. Your caregiver may recommend corticosteroid injections to help reduce inflammation of the sciatic nerve. If symptoms persist despite non-surgical (conservative) treatment, then surgery may be recommended. MEDICATION  If pain medication is necessary, then nonsteroidal anti-inflammatory medications, such as aspirin and ibuprofen, or other minor pain relievers, such as acetaminophen, are often recommended.  Do not take pain medication for 7 days before surgery.  Prescription pain relievers may be given if deemed necessary by your caregiver. Use only as directed and only as much as you need.  Ointments applied to the skin may be helpful.  Corticosteroid injections may be given by your caregiver. These injections should be reserved for the most serious cases, because they may only be given a certain number of times. HEAT AND COLD  Cold treatment (icing) relieves pain and reduces inflammation. Cold treatment should be applied for 10 to 15 minutes every 2 to 3 hours for inflammation and pain and  immediately after any activity that aggravates your symptoms. Use ice packs or  massage the area with a piece of ice (ice massage).  Heat treatment may be used prior to performing the stretching and strengthening activities prescribed by your caregiver, physical therapist, or athletic trainer. Use a heat pack or soak the injury in warm water. SEEK MEDICAL CARE IF:  Treatment seems to offer no benefit, or the condition worsens.  Any medications produce adverse side effects. EXERCISES  RANGE OF MOTION (ROM) AND STRETCHING EXERCISES - Sciatica Most people with sciatic will find that their symptoms worsen with either excessive bending forward (flexion) or arching at the low back (extension). The exercises which will help resolve your symptoms will focus on the opposite motion. Your physician, physical therapist or athletic trainer will help you determine which exercises will be most helpful to resolve your low back pain. Do not complete any exercises without first consulting with your clinician. Discontinue any exercises which worsen your symptoms until you speak to your clinician. If you have pain, numbness or tingling which travels down into your buttocks, leg or foot, the goal of the therapy is for these symptoms to move closer to your back and eventually resolve. Occasionally, these leg symptoms will get better, but your low back pain may worsen; this is typically an indication of progress in your rehabilitation. Be certain to be very alert to any changes in your symptoms and the activities in which you participated in the 24 hours prior to the change. Sharing this information with your clinician will allow him/her to most efficiently treat your condition. These exercises may help you when beginning to rehabilitate your injury. Your symptoms may resolve with or without further involvement from your physician, physical therapist or athletic trainer. While completing these exercises, remember:   Restoring tissue flexibility helps normal motion to return to the joints. This allows  healthier, less painful movement and activity.  An effective stretch should be held for at least 30 seconds.  A stretch should never be painful. You should only feel a gentle lengthening or release in the stretched tissue. FLEXION RANGE OF MOTION AND STRETCHING EXERCISES: STRETCH - Flexion, Single Knee to Chest   Lie on a firm bed or floor with both legs extended in front of you.  Keeping one leg in contact with the floor, bring your opposite knee to your chest. Hold your leg in place by either grabbing behind your thigh or at your knee.  Pull until you feel a gentle stretch in your low back. Hold __________ seconds.  Slowly release your grasp and repeat the exercise with the opposite side. Repeat __________ times. Complete this exercise __________ times per day.  STRETCH - Flexion, Double Knee to Chest  Lie on a firm bed or floor with both legs extended in front of you.  Keeping one leg in contact with the floor, bring your opposite knee to your chest.  Tense your stomach muscles to support your back and then lift your other knee to your chest. Hold your legs in place by either grabbing behind your thighs or at your knees.  Pull both knees toward your chest until you feel a gentle stretch in your low back. Hold __________ seconds.  Tense your stomach muscles and slowly return one leg at a time to the floor. Repeat __________ times. Complete this exercise __________ times per day.  STRETCH - Low Trunk Rotation   Lie on a firm bed or floor. Keeping your legs in front  of you, bend your knees so they are both pointed toward the ceiling and your feet are flat on the floor.  Extend your arms out to the side. This will stabilize your upper body by keeping your shoulders in contact with the floor.  Gently and slowly drop both knees together to one side until you feel a gentle stretch in your low back. Hold for __________ seconds.  Tense your stomach muscles to support your low back as  you bring your knees back to the starting position. Repeat the exercise to the other side. Repeat __________ times. Complete this exercise __________ times per day  EXTENSION RANGE OF MOTION AND FLEXIBILITY EXERCISES: STRETCH - Extension, Prone on Elbows  Lie on your stomach on the floor, a bed will be too soft. Place your palms about shoulder width apart and at the height of your head.  Place your elbows under your shoulders. If this is too painful, stack pillows under your chest.  Allow your body to relax so that your hips drop lower and make contact more completely with the floor.  Hold this position for __________ seconds.  Slowly return to lying flat on the floor. Repeat __________ times. Complete this exercise __________ times per day.  RANGE OF MOTION - Extension, Prone Press Ups  Lie on your stomach on the floor, a bed will be too soft. Place your palms about shoulder width apart and at the height of your head.  Keeping your back as relaxed as possible, slowly straighten your elbows while keeping your hips on the floor. You may adjust the placement of your hands to maximize your comfort. As you gain motion, your hands will come more underneath your shoulders.  Hold this position __________ seconds.  Slowly return to lying flat on the floor. Repeat __________ times. Complete this exercise __________ times per day.  STRENGTHENING EXERCISES - Sciatica  These exercises may help you when beginning to rehabilitate your injury. These exercises should be done near your "sweet spot." This is the neutral, low-back arch, somewhere between fully rounded and fully arched, that is your least painful position. When performed in this safe range of motion, these exercises can be used for people who have either a flexion or extension based injury. These exercises may resolve your symptoms with or without further involvement from your physician, physical therapist or athletic trainer. While completing  these exercises, remember:   Muscles can gain both the endurance and the strength needed for everyday activities through controlled exercises.  Complete these exercises as instructed by your physician, physical therapist or athletic trainer. Progress with the resistance and repetition exercises only as your caregiver advises.  You may experience muscle soreness or fatigue, but the pain or discomfort you are trying to eliminate should never worsen during these exercises. If this pain does worsen, stop and make certain you are following the directions exactly. If the pain is still present after adjustments, discontinue the exercise until you can discuss the trouble with your clinician. STRENGTHENING - Deep Abdominals, Pelvic Tilt   Lie on a firm bed or floor. Keeping your legs in front of you, bend your knees so they are both pointed toward the ceiling and your feet are flat on the floor.  Tense your lower abdominal muscles to press your low back into the floor. This motion will rotate your pelvis so that your tail bone is scooping upwards rather than pointing at your feet or into the floor.  With a gentle tension and even  breathing, hold this position for __________ seconds. Repeat __________ times. Complete this exercise __________ times per day.  STRENGTHENING - Abdominals, Crunches   Lie on a firm bed or floor. Keeping your legs in front of you, bend your knees so they are both pointed toward the ceiling and your feet are flat on the floor. Cross your arms over your chest.  Slightly tip your chin down without bending your neck.  Tense your abdominals and slowly lift your trunk high enough to just clear your shoulder blades. Lifting higher can put excessive stress on the low back and does not further strengthen your abdominal muscles.  Control your return to the starting position. Repeat __________ times. Complete this exercise __________ times per day.  STRENGTHENING - Quadruped, Opposite  UE/LE Lift  Assume a hands and knees position on a firm surface. Keep your hands under your shoulders and your knees under your hips. You may place padding under your knees for comfort.  Find your neutral spine and gently tense your abdominal muscles so that you can maintain this position. Your shoulders and hips should form a rectangle that is parallel with the floor and is not twisted.  Keeping your trunk steady, lift your right hand no higher than your shoulder and then your left leg no higher than your hip. Make sure you are not holding your breath. Hold this position __________ seconds.  Continuing to keep your abdominal muscles tense and your back steady, slowly return to your starting position. Repeat with the opposite arm and leg. Repeat __________ times. Complete this exercise __________ times per day.  STRENGTHENING - Abdominals and Quadriceps, Straight Leg Raise   Lie on a firm bed or floor with both legs extended in front of you.  Keeping one leg in contact with the floor, bend the other knee so that your foot can rest flat on the floor.  Find your neutral spine, and tense your abdominal muscles to maintain your spinal position throughout the exercise.  Slowly lift your straight leg off the floor about 6 inches for a count of 15, making sure to not hold your breath.  Still keeping your neutral spine, slowly lower your leg all the way to the floor. Repeat this exercise with each leg __________ times. Complete this exercise __________ times per day. POSTURE AND BODY MECHANICS CONSIDERATIONS - Sciatica Keeping correct posture when sitting, standing or completing your activities will reduce the stress put on different body tissues, allowing injured tissues a chance to heal and limiting painful experiences. The following are general guidelines for improved posture. Your physician or physical therapist will provide you with any instructions specific to your needs. While reading these  guidelines, remember:  The exercises prescribed by your provider will help you have the flexibility and strength to maintain correct postures.  The correct posture provides the optimal environment for your joints to work. All of your joints have less wear and tear when properly supported by a spine with good posture. This means you will experience a healthier, less painful body.  Correct posture must be practiced with all of your activities, especially prolonged sitting and standing. Correct posture is as important when doing repetitive low-stress activities (typing) as it is when doing a single heavy-load activity (lifting). RESTING POSITIONS Consider which positions are most painful for you when choosing a resting position. If you have pain with flexion-based activities (sitting, bending, stooping, squatting), choose a position that allows you to rest in a less flexed posture. You would  want to avoid curling into a fetal position on your side. If your pain worsens with extension-based activities (prolonged standing, working overhead), avoid resting in an extended position such as sleeping on your stomach. Most people will find more comfort when they rest with their spine in a more neutral position, neither too rounded nor too arched. Lying on a non-sagging bed on your side with a pillow between your knees, or on your back with a pillow under your knees will often provide some relief. Keep in mind, being in any one position for a prolonged period of time, no matter how correct your posture, can still lead to stiffness. PROPER SITTING POSTURE In order to minimize stress and discomfort on your spine, you must sit with correct posture Sitting with good posture should be effortless for a healthy body. Returning to good posture is a gradual process. Many people can work toward this most comfortably by using various supports until they have the flexibility and strength to maintain this posture on their  own. When sitting with proper posture, your ears will fall over your shoulders and your shoulders will fall over your hips. You should use the back of the chair to support your upper back. Your low back will be in a neutral position, just slightly arched. You may place a small pillow or folded towel at the base of your low back for support.  When working at a desk, create an environment that supports good, upright posture. Without extra support, muscles fatigue and lead to excessive strain on joints and other tissues. Keep these recommendations in mind: CHAIR:   A chair should be able to slide under your desk when your back makes contact with the back of the chair. This allows you to work closely.  The chair's height should allow your eyes to be level with the upper part of your monitor and your hands to be slightly lower than your elbows. BODY POSITION  Your feet should make contact with the floor. If this is not possible, use a foot rest.  Keep your ears over your shoulders. This will reduce stress on your neck and low back. INCORRECT SITTING POSTURES   If you are feeling tired and unable to assume a healthy sitting posture, do not slouch or slump. This puts excessive strain on your back tissues, causing more damage and pain. Healthier options include:  Using more support, like a lumbar pillow.  Switching tasks to something that requires you to be upright or walking.  Talking a brief walk.  Lying down to rest in a neutral-spine position. PROLONGED STANDING WHILE SLIGHTLY LEANING FORWARD  When completing a task that requires you to lean forward while standing in one place for a long time, place either foot up on a stationary 2-4 inch high object to help maintain the best posture. When both feet are on the ground, the low back tends to lose its slight inward curve. If this curve flattens (or becomes too large), then the back and your other joints will experience too much stress, fatigue more  quickly and can cause pain.  CORRECT STANDING POSTURES Proper standing posture should be assumed with all daily activities, even if they only take a few moments, like when brushing your teeth. As in sitting, your ears should fall over your shoulders and your shoulders should fall over your hips. You should keep a slight tension in your abdominal muscles to brace your spine. Your tailbone should point down to the ground, not behind your  body, resulting in an over-extended swayback posture.  INCORRECT STANDING POSTURES  Common incorrect standing postures include a forward head, locked knees and/or an excessive swayback. WALKING Walk with an upright posture. Your ears, shoulders and hips should all line-up. PROLONGED ACTIVITY IN A FLEXED POSITION When completing a task that requires you to bend forward at your waist or lean over a low surface, try to find a way to stabilize 3 of 4 of your limbs. You can place a hand or elbow on your thigh or rest a knee on the surface you are reaching across. This will provide you more stability so that your muscles do not fatigue as quickly. By keeping your knees relaxed, or slightly bent, you will also reduce stress across your low back. CORRECT LIFTING TECHNIQUES DO :   Assume a wide stance. This will provide you more stability and the opportunity to get as close as possible to the object which you are lifting.  Tense your abdominals to brace your spine; then bend at the knees and hips. Keeping your back locked in a neutral-spine position, lift using your leg muscles. Lift with your legs, keeping your back straight.  Test the weight of unknown objects before attempting to lift them.  Try to keep your elbows locked down at your sides in order get the best strength from your shoulders when carrying an object.  Always ask for help when lifting heavy or awkward objects. INCORRECT LIFTING TECHNIQUES DO NOT:   Lock your knees when lifting, even if it is a small  object.  Bend and twist. Pivot at your feet or move your feet when needing to change directions.  Assume that you cannot safely pick up a paperclip without proper posture.   This information is not intended to replace advice given to you by your health care provider. Make sure you discuss any questions you have with your health care provider.   Document Released: 11/13/2005 Document Revised: 03/30/2015 Document Reviewed: 02/25/2009 [employer] Interactive Patient Education [employer]

## 2016-09-05 ENCOUNTER — Other Ambulatory Visit: Payer: BC Managed Care – PPO

## 2016-09-11 ENCOUNTER — Ambulatory Visit
Admission: RE | Admit: 2016-09-11 | Discharge: 2016-09-11 | Disposition: A | Discharge status: Home / Self Care | Payer: BC Managed Care – PPO | Source: Ambulatory Visit | Attending: Family | Admitting: Family

## 2016-09-11 DIAGNOSIS — M47896 Other spondylosis, lumbar region: Secondary | ICD-10-CM | POA: Insufficient documentation

## 2016-09-11 DIAGNOSIS — M5137 Other intervertebral disc degeneration, lumbosacral region: Secondary | ICD-10-CM | POA: Diagnosis not present

## 2016-09-11 DIAGNOSIS — M5442 Lumbago with sciatica, left side: Secondary | ICD-10-CM

## 2016-09-11 DIAGNOSIS — R932 Abnormal findings on diagnostic imaging of liver and biliary tract: Secondary | ICD-10-CM | POA: Diagnosis not present

## 2016-09-11 DIAGNOSIS — R109 Unspecified abdominal pain: Secondary | ICD-10-CM | POA: Diagnosis present

## 2016-09-13 ENCOUNTER — Telehealth: Payer: Self-pay | Admitting: Internal Medicine

## 2016-09-13 NOTE — Telephone Encounter (Signed)
Pt called and was returning your call. Thank you!  Call pt @ [phone]

## 2016-09-13 NOTE — Telephone Encounter (Signed)
Pt called back regarding lab results. Thank you!  Call pt @ [phone].

## 2016-09-13 NOTE — Telephone Encounter (Signed)
Patient has been informed.

## 2016-09-22 ENCOUNTER — Other Ambulatory Visit (INDEPENDENT_AMBULATORY_CARE_PROVIDER_SITE_OTHER): Payer: BC Managed Care – PPO

## 2016-09-22 DIAGNOSIS — E119 Type 2 diabetes mellitus without complications: Secondary | ICD-10-CM

## 2016-09-22 DIAGNOSIS — E7800 Pure hypercholesterolemia, unspecified: Secondary | ICD-10-CM

## 2016-09-22 DIAGNOSIS — I10 Essential (primary) hypertension: Secondary | ICD-10-CM

## 2016-09-22 LAB — BASIC METABOLIC PANEL
BUN: 18 mg/dL (ref 6–23)
CALCIUM: 9.1 mg/dL (ref 8.4–10.5)
CO2: 27 meq/L (ref 19–32)
CREATININE: 1.03 mg/dL (ref 0.40–1.50)
Chloride: 104 mEq/L (ref 96–112)
GFR: 80.56 mL/min (ref >60.00)
Glucose, Bld: 147 mg/dL — ABNORMAL HIGH (ref 70–99)
Potassium: 4.2 mEq/L (ref 3.5–5.1)
Sodium: 138 mEq/L (ref 135–145)

## 2016-09-22 LAB — LIPID PANEL
CHOLESTEROL: 184 mg/dL (ref 0–200)
HDL: 35.2 mg/dL — AB (ref >39.00)
LDL Cholesterol: 127 mg/dL — ABNORMAL HIGH (ref 0–99)
NonHDL: 148.4
TRIGLYCERIDES: 107 mg/dL (ref 0.0–149.0)
Total CHOL/HDL Ratio: 5
VLDL: 21.4 mg/dL (ref 0.0–40.0)

## 2016-09-22 LAB — CBC WITH DIFFERENTIAL/PLATELET
BASOS ABS: 0 10*3/uL (ref 0.0–0.1)
Basophils Relative: 0.4 % (ref 0.0–3.0)
EOS ABS: 0.1 10*3/uL (ref 0.0–0.7)
Eosinophils Relative: 1.9 % (ref 0.0–5.0)
HEMATOCRIT: 44.9 % (ref 39.0–52.0)
HEMOGLOBIN: 15.3 g/dL (ref 13.0–17.0)
LYMPHS PCT: 26.3 % (ref 12.0–46.0)
Lymphs Abs: 1.6 10*3/uL (ref 0.7–4.0)
MCHC: 34.1 g/dL (ref 30.0–36.0)
MCV: 88.4 fl (ref 78.0–100.0)
MONO ABS: 0.4 10*3/uL (ref 0.1–1.0)
Monocytes Relative: 6.6 % (ref 3.0–12.0)
Neutro Abs: 3.9 10*3/uL (ref 1.4–7.7)
Neutrophils Relative %: 64.8 % (ref 43.0–77.0)
Platelets: 176 10*3/uL (ref 150.0–400.0)
RBC: 5.08 Mil/uL (ref 4.22–5.81)
RDW: 13.5 % (ref 11.5–15.5)
WBC: 5.9 10*3/uL (ref 4.0–10.5)

## 2016-09-22 LAB — HEMOGLOBIN A1C: HEMOGLOBIN A1C: 7.6 % — AB (ref 4.6–6.5)

## 2016-09-22 LAB — HEPATIC FUNCTION PANEL
ALBUMIN: 4.2 g/dL (ref 3.5–5.2)
ALT: 27 U/L (ref 0–53)
AST: 18 U/L (ref 0–37)
Alkaline Phosphatase: 43 U/L (ref 39–117)
BILIRUBIN TOTAL: 0.8 mg/dL (ref 0.2–1.2)
Bilirubin, Direct: 0.1 mg/dL (ref 0.0–0.3)
TOTAL PROTEIN: 6.5 g/dL (ref 6.0–8.3)

## 2016-09-25 LAB — TSH: TSH: 0.8 u[IU]/mL (ref 0.35–4.50)

## 2016-10-02 ENCOUNTER — Encounter: Payer: Self-pay | Admitting: Internal Medicine

## 2016-10-02 ENCOUNTER — Ambulatory Visit (INDEPENDENT_AMBULATORY_CARE_PROVIDER_SITE_OTHER): Payer: BC Managed Care – PPO | Admitting: Internal Medicine

## 2016-10-02 ENCOUNTER — Encounter: Payer: Self-pay | Admitting: Gastroenterology

## 2016-10-02 VITALS — BP 132/80 | HR 64 | Temp 98.5°F | Ht 68.0 in | Wt 199.6 lb

## 2016-10-02 DIAGNOSIS — R110 Nausea: Secondary | ICD-10-CM | POA: Diagnosis not present

## 2016-10-02 DIAGNOSIS — E119 Type 2 diabetes mellitus without complications: Secondary | ICD-10-CM

## 2016-10-02 DIAGNOSIS — I10 Essential (primary) hypertension: Secondary | ICD-10-CM

## 2016-10-02 DIAGNOSIS — F439 Reaction to severe stress, unspecified: Secondary | ICD-10-CM

## 2016-10-02 DIAGNOSIS — E7800 Pure hypercholesterolemia, unspecified: Secondary | ICD-10-CM | POA: Diagnosis not present

## 2016-10-02 DIAGNOSIS — G8929 Other chronic pain: Secondary | ICD-10-CM

## 2016-10-02 DIAGNOSIS — M5442 Lumbago with sciatica, left side: Secondary | ICD-10-CM

## 2016-10-02 MED ORDER — IRBESARTAN 75 MG PO TABS: 75.0000 mg | ORAL_TABLET | Freq: Every day | ORAL | 1 refills | Status: DC

## 2016-10-02 NOTE — Progress Notes (Signed)
Pre visit review using our clinic review tool, if applicable. No additional management support is needed unless otherwise documented below in the visit note. 

## 2016-10-02 NOTE — Progress Notes (Signed)
Patient ID: Jackson Cooper, male   DOB: [birth year], 50 y.o.   MRN: [id number]   Subjective:    Patient ID: Jackson Cooper, male    DOB: [birth year], 50 y.o.   MRN: [id number]  HPI  Patient here for a scheduled follow up.  He is accompanied by his wife.  History obtained from both of them.  Recently had labs. Reviewed lab results with him and his wife.  Sugar is elevated.  a1c 7.6.  He has not been watching his diet.  Eating a lot of sweets and snacks.  Drinking sweet tea.  Does not check his blood sugar.  Has a hard time with blood draws.  Passes out easily.  Discussed with him today regarding trying to check his own sugars.  Increased stress with his job and with working at his house.  Saw Rennie Plowman on 08/30/16 for low back pain.  Was placed on prednisone.  Had MRI.  Report reviewed and attached.  Discussed results today.  He is better.  Desires no further w/up at this time.  Also discussed results of abdominal ultrasound.  Fatty liver found.  Discussed diet, exercise and weight loss.  Discussed lifestyles referral.  He is in agreement.     Past Medical History:  Diagnosis Date  . DVT (deep venous thrombosis) (HCC) Diagnosed 9/08  . Encephalitis 1970  . Enlarged prostate   . Hyperglycemia   . Nephrolithiasis    Past Surgical History:  Procedure Laterality Date  . EYE SURGERY  1972   Family History  Problem Relation Age of Onset  . Hypertension Mother   . Breast cancer Sister    Social History   Social History  . Marital status: Married    Spouse name: N/A  . Number of children: N/A  . Years of education: N/A   Social History Main Topics  . Smoking status: Never Smoker  . Smokeless tobacco: Never Used  . Alcohol use No  . Drug use: No  . Sexual activity: Not Asked   Other Topics Concern  . None   Social History Narrative  . None    Outpatient Encounter Prescriptions as of 10/02/2016  Medication Sig  . amLODipine (NORVASC) 5 MG tablet Take 1 tablet (5 mg total) by  mouth daily.  [name] aspirin 81 MG tablet Take 81 mg by mouth daily. Take 1 tablet by mouth once a day  . cetirizine (ZYRTEC) 10 MG tablet Take 10 mg by mouth daily as needed.   . Flaxseed Oil OIL by Does not apply route. 1000 mg  . fluticasone (FLONASE) 50 MCG/ACT nasal spray Place 2 sprays into the nose daily.  [name] LORazepam (ATIVAN) 0.5 MG tablet Take 0.5 mg by mouth every 8 (eight) hours. 1 tablet as directed  . [DISCONTINUED] losartan (COZAAR) 25 MG tablet Take 1 tablet (25 mg total) by mouth daily.  . [DISCONTINUED] predniSONE (DELTASONE) 10 MG tablet Take 4 tablets ( total 40 mg) by mouth for 2 days; take 3 tablets ( total 30 mg) by mouth for 2 days; take 2 tablets (total 20mg ) by mouth for 2 days; then take 1 tablet ( total 10mg ) by mouth for 2 days.  . irbesartan (AVAPRO) 75 MG tablet Take 1 tablet (75 mg total) by mouth daily.   No facility-administered encounter medications on file as of 10/02/2016.     Review of Systems  Constitutional: Negative for appetite change.       Not watching his diet.  HENT: Negative for congestion and sinus pressure.   Respiratory: Negative for cough, chest tightness and shortness of breath.   Cardiovascular: Negative for chest pain, palpitations and leg swelling.  Gastrointestinal: Negative for diarrhea and vomiting.       Some nausea with eating bread.  No dysphagia.  No significant abdominal pain or cramping.  No blood in his stool.    Genitourinary: Negative for difficulty urinating and dysuria.  Musculoskeletal: Negative for joint swelling.       Back is better.    Skin: Negative for color change and rash.  Neurological: Negative for dizziness, light-headedness and headaches.  Psychiatric/Behavioral: Negative for agitation and dysphoric mood.       Objective:     Blood pressure rechecked by me:  132/80  Physical Exam  Constitutional: He appears well-developed and well-nourished. No distress.  HENT:  Nose: Nose normal.  Mouth/Throat:  Oropharynx is clear and moist.  Eyes: Conjunctivae are normal. Right eye exhibits no discharge. Left eye exhibits no discharge.  Neck: Neck supple. No thyromegaly present.  Cardiovascular: Normal rate and regular rhythm.   Pulmonary/Chest: Effort normal and breath sounds normal. No respiratory distress.  Abdominal: Soft. Bowel sounds are normal. There is no tenderness.  Musculoskeletal: He exhibits no edema or tenderness.  Lymphadenopathy:    He has no cervical adenopathy.  Skin: No rash noted. No erythema.  Psychiatric: He has a normal mood and affect. His behavior is normal.    BP 132/80   Pulse 64   Temp 98.5 F (36.9 C) (Oral)   Ht 5\' 8"  (1.727 m)   Wt 199 lb 9.6 oz (90.5 kg)   SpO2 96%   BMI 30.35 kg/m  Wt Readings from Last 3 Encounters:  10/02/16 199 lb 9.6 oz (90.5 kg)  06/26/16 197 lb 4 oz (89.5 kg)  04/12/16 200 lb (90.7 kg)     Lab Results  Component Value Date   WBC 5.9 09/22/2016   HGB 15.3 09/22/2016   HCT 44.9 09/22/2016   PLT 176.0 09/22/2016   GLUCOSE 147 (H) 09/22/2016   CHOL 184 09/22/2016   TRIG 107.0 09/22/2016   HDL 35.20 (L) 09/22/2016   LDLCALC 127 (H) 09/22/2016   ALT 27 09/22/2016   AST 18 09/22/2016   NA 138 09/22/2016   K 4.2 09/22/2016   CL 104 09/22/2016   CREATININE 1.03 09/22/2016   BUN 18 09/22/2016   CO2 27 09/22/2016   TSH 0.80 09/22/2016   PSA 0.67 04/12/2016   HGBA1C 7.6 (H) 09/22/2016   MICROALBUR 1.0 09/13/2015    Mr Lumbar Spine Wo Contrast  Result Date: 09/11/2016 CLINICAL DATA:  Low back pain with radiculopathy for 1 year. EXAM: MRI LUMBAR SPINE WITHOUT CONTRAST TECHNIQUE: Multiplanar, multisequence MR imaging of the lumbar spine was performed. No intravenous contrast was administered. COMPARISON:  Multiple exams, including 04/28/2008 FINDINGS: Segmentation: A transitional lumbosacral vertebra is assumed to represent the S1 level. Careful correlation with this numbering strategy prior to any procedural intervention  would be recommended. Rudimentary disc material at the S1- 2 level. Alignment:  No vertebral subluxation is observed. Vertebrae: 1.4 cm hemangioma posteriorly in the L1 vertebral body. 1.3 cm hemangioma posteriorly in the L3 vertebral body. No significant vertebral marrow edema is identified. Conus medullaris: Extends to the L1 level and appears normal. Paraspinal and other soft tissues: Unremarkable Disc levels: L1-2: Unremarkable. L2- 3: Unremarkable. L3-4:  No impingement.  Mild disc bulge. L4-5:  No impingement.  Mild disc bulge. L5-S1: Mild  right foraminal stenosis due to facet arthropathy. Mild disc bulge. S1-2:  No impingement.  Rudimentary disc. IMPRESSION: 1. Mild lumbar spondylosis and degenerative disc disease, causing mild right foraminal impingement at the L5-S1 level. 2. A transitional lumbosacral vertebra is assumed to represent the S1 level. Careful correlation with this numbering strategy prior to any procedural intervention would be recommended. Electronically Signed   By: Gaylyn RongWalter  Liebkemann M.D.   On: 09/11/2016 09:43   [place] Abdomen Complete  Result Date: 09/11/2016 CLINICAL DATA:  Abdominal pain. EXAM: ABDOMEN ULTRASOUND COMPLETE COMPARISON:  CT 04/28/2008. FINDINGS: Gallbladder: No gallstones or wall thickening visualized. No sonographic Murphy sign noted by sonographer. Common bile duct: Diameter: 2.5 mm Liver: Liver has a heterogeneous echotexture suggesting fatty infiltration and/or hepatocellular disease. An area of focal fatty sparing adjacent to gallbladder fossa may be present. IVC: No abnormality visualized. Pancreas: Visualized portion unremarkable. Spleen: Size and appearance within normal limits. Right Kidney: Length: 12.1 cm. Echogenicity within normal limits. No mass or hydronephrosis visualized. Left Kidney: Length: 11.8 cm. Echogenicity within normal limits. No mass or hydronephrosis visualized. Abdominal aorta: No aneurysm visualized. Other findings: None. IMPRESSION: Liver  has a heterogeneous echotexture suggesting fatty infiltration and/or hepatocellular disease. An area of focal fatty sparing adjacent to the gallbladder fossa may be present . Exam otherwise unremarkable. Electronically Signed   By: Maisie Fushomas  Register   On: 09/11/2016 08:28       Assessment & Plan:   Problem List Items Addressed This Visit    Diabetes mellitus (HCC)    Sugars elevated.  Gave him a glucometer.  Hopefully he will be able to start checking his sugar.  Has problems with blood draws, etc.  Discussed diet and exercise.  Discussed the need for weight loss.  Discussed treatment.  He declines medication.  Discussed lifestyles referral.  He is in agreement.  Follow.        Relevant Medications   irbesartan (AVAPRO) 75 MG tablet   Other Relevant Orders   Ambulatory referral to diabetic education   Essential hypertension, benign    Blood pressure on recheck improved.  He is concerned the losartan is contributing to some of his GI issues.  Had cough with lisinopril.  Would like to keep him on ARB given history of diabetes.  Change to avapro 75mg  q day.  Follow pressures.        Relevant Medications   irbesartan (AVAPRO) 75 MG tablet   Hypercholesterolemia    Low cholesterol diet and exercise.  Follow lipid panel.  Hold on medication.        Relevant Medications   irbesartan (AVAPRO) 75 MG tablet   Low back pain    Better.  Discussed MRI results today.  Wants to hold on any further w/up.  Follow.        Stress    Discussed with him today.   Does not feel needs any further intervention at this time.  Follow.        Other Visit Diagnoses    Nausea    -  Primary   notices with eating bread.  was questioning if his medication could be contributing to his symptoms.  had abdominal ultrasound.  discussed results.  med changed     I spent 40 minutes with the patient and more than 50% of the time was spent in consultation regarding the above.  Time was spent discussing his current  issues and concerns, going over lab results and previous scans.  Time also spent  discussing possible treatment options and plan for treatment and follow up evaluation.     Dale , MD

## 2016-10-03 ENCOUNTER — Encounter: Payer: Self-pay | Admitting: Internal Medicine

## 2016-10-03 NOTE — Assessment & Plan Note (Signed)
Blood pressure on recheck improved.  He is concerned the losartan is contributing to some of his GI issues.  Had cough with lisinopril.  Would like to keep him on ARB given history of diabetes.  Change to avapro 75mg  q day.  Follow pressures.

## 2016-10-03 NOTE — Assessment & Plan Note (Signed)
Sugars elevated.  Gave him a glucometer.  Hopefully he will be able to start checking his sugar.  Has problems with blood draws, etc.  Discussed diet and exercise.  Discussed the need for weight loss.  Discussed treatment.  He declines medication.  Discussed lifestyles referral.  He is in agreement.  Follow.

## 2016-10-03 NOTE — Assessment & Plan Note (Signed)
Discussed with him today.   Does not feel needs any further intervention at this time.  Follow.

## 2016-10-03 NOTE — Assessment & Plan Note (Signed)
Better.  Discussed MRI results today.  Wants to hold on any further w/up.  Follow.

## 2016-10-03 NOTE — Assessment & Plan Note (Signed)
Low cholesterol diet and exercise.  Follow lipid panel.  Hold on medication.

## 2016-10-10 ENCOUNTER — Other Ambulatory Visit: Payer: Self-pay | Admitting: Internal Medicine

## 2016-10-24 ENCOUNTER — Telehealth: Payer: Self-pay | Admitting: *Deleted

## 2016-10-24 NOTE — Telephone Encounter (Signed)
Please advise 

## 2016-10-24 NOTE — Telephone Encounter (Signed)
Pt's wife received a letter about the scheduled colon ostomy, however the letter appears to be for a consult, pt was not interested in a consult, just the procedure. Pt also was interested in having the testing done locally, pt [facility].  Please contact wife [phone]

## 2016-10-25 NOTE — Telephone Encounter (Signed)
I do not have anything to do with what they are scheduled for. This is up to the GI office. He may have conditions that require a consult prior to the colonoscopy.

## 2016-10-26 NOTE — Telephone Encounter (Signed)
Can we refer him locally?

## 2016-10-30 ENCOUNTER — Encounter: Payer: BC Managed Care – PPO | Attending: Internal Medicine | Admitting: Dietician

## 2016-10-30 ENCOUNTER — Encounter: Payer: Self-pay | Admitting: Dietician

## 2016-10-30 VITALS — BP 128/82 | Ht 68.0 in | Wt 204.2 lb

## 2016-10-30 DIAGNOSIS — Z6830 Body mass index (BMI) 30.0-30.9, adult: Secondary | ICD-10-CM | POA: Insufficient documentation

## 2016-10-30 DIAGNOSIS — Z713 Dietary counseling and surveillance: Secondary | ICD-10-CM | POA: Insufficient documentation

## 2016-10-30 DIAGNOSIS — E119 Type 2 diabetes mellitus without complications: Secondary | ICD-10-CM | POA: Insufficient documentation

## 2016-10-30 NOTE — Patient Instructions (Signed)
  Check blood sugars 2 x day before breakfast or 2 hrs after supper every day Exercise:  Walk 1 hour/day 5x/wk.  Avoid sugar sweetened drinks (soda, tea, coffee, sports drinks, juices) Limit intake of fried foods and sweets/desserts Eat 3 meals day,  1- 2 snacks a day in afternoon if needed and at bedtime Eat 3 carbohydrate servings/meal + protein Eat 1 carbohydrate serving/snack + protein Space meals 4-6 hours apart Bring blood sugar records to the next appointment/class Call your doctor for a prescription for:  1. Meter strips (type)  One Touch Verio test strips  checking  1 times per day  2. Lancets (type) One Touch Delica lancets  checking  1   times per day Get a Sharps container Return for appointment/classes on: class 1 on 11-09-16

## 2016-10-30 NOTE — Progress Notes (Signed)
Diabetes Self-Management Education  Visit Type: First/Initial  Appt. Start Time: 1130 Appt. End Time: 1230  10/30/2016  [name], identified by name and date of birth, is a 50 y.o. male with a diagnosis of Diabetes: Type 2.   ASSESSMENT  Blood pressure 128/82, height 5\' 8"  (1.727 m), weight 204 lb 3.2 oz (92.6 kg). Body mass index is 31.05 kg/m. Lacks knowledge of diabetes care     Diabetes Self-Management Education - 10/30/16 1302      Visit Information   Visit Type First/Initial     Initial Visit   Diabetes Type Type 2     Health Coping   How would you rate your overall health? Good     Psychosocial Assessment   Patient Belief/Attitude about Diabetes Motivated to manage diabetes   Self-care barriers None   Other persons present Spouse/SO   Patient Concerns Weight Control;Glycemic Control;Healthy Lifestyle;Nutrition/Meal planning  become more fit, prevent complications   Special Needs None   Preferred Learning Style Auditory;Hands on   Learning Readiness Ready   What is the last grade level you completed in school? 12+     Pre-Education Assessment   Patient understands the diabetes disease and treatment process. Needs Instruction   Patient understands incorporating nutritional management into lifestyle. Needs Instruction   Patient undertands incorporating physical activity into lifestyle. Needs Instruction   Patient understands using medications safely. Needs Instruction   Patient understands monitoring blood glucose, interpreting and using results Needs Instruction   Patient understands prevention, detection, and treatment of acute complications. Needs Instruction   Patient understands prevention, detection, and treatment of chronic complications. Needs Instruction   Patient understands how to develop strategies to address psychosocial issues. Needs Instruction   Patient understands how to develop strategies to promote health/change behavior. Needs  Instruction     Complications   Last HgB A1C per patient/outside source 7.6 %  09-22-16   How often do you check your blood sugar? 0 times/day (not testing)  has One Touch Verio meter but not testing BG's yet   Have you had a dilated eye exam in the past 12 months? No  2 yrs ago-appt 10-31-16   Have you had a dental exam in the past 12 months? Yes  6 months ago-appt 10-30-16   Are you checking your feet? Yes     Dietary Intake   Breakfast --  eats breakfast 6:30a-8a; time varies due to work schedule; eats fried foods 3-4x/wk and eats  out 2-5x/wk.   Snack (morning) --  none   Lunch --  eats lunch 11a-12p=time varies due to work; eats out 2-5x/wk.; has decreased carb intake and sweets and increased vegetables in diet   Snack (afternoon) --  eats occasional 3p snack=1 piece candy or cakes, cookie   Dinner --  eats supper at 6:30-7p   Snack (evening) --  occasional nuts or popcorn   Beverage(s) --  drinks water 4-5x/day, diet soda 0-1x/day and half and half sweet tea 4-5x/day     Exercise   Exercise Type Light (walking / raking leaves)  walks a lot at work (>10,000 steps daily when working)   How many days per week to you exercise? 5     Patient Education   Previous Diabetes Education No   Disease state  Definition of diabetes, type 1 and 2, and the diagnosis of diabetes;Explored patient's options for treatment of their diabetes   Nutrition management  Role of diet in the treatment of diabetes and the  relationship between the three main macronutrients and blood glucose level;Food label reading, portion sizes and measuring food.;Carbohydrate counting   Physical activity and exercise  Role of exercise on diabetes management, blood pressure control and cardiac health.;Helped patient identify appropriate exercises in relation to his/her diabetes, diabetes complications and other health issue.   Monitoring Taught/evaluated SMBG meter.;Purpose and frequency of SMBG.;Taught/discussed  recording of test results and interpretation of SMBG.;Identified appropriate SMBG and/or A1C goals.;Yearly dilated eye exam  pt has One Touch Verio Flex meter but has not been testing BG's; instructed pt on use of meter-BG 168 (3 hr pp)   Chronic complications Relationship between chronic complications and blood glucose control;Lipid levels, blood glucose control and heart disease;Identified and discussed with patient  current chronic complications;Dental care;Retinopathy and reason for yearly dilated eye exams   Psychosocial adjustment Role of stress on diabetes   Personal strategies to promote health Lifestyle issues that need to be addressed for better diabetes care;Helped patient develop diabetes management plan for (enter comment)      Individualized Plan for Diabetes Self-Management Training:   Learning Objective:  Patient will have a greater understanding of diabetes self-management. Patient education plan is to attend individual and/or group sessions per assessed needs and concerns.   Plan:   Patient Instructions   Check blood sugars 2 x day before breakfast or 2 hrs after supper every day Exercise:  Walk 1 hour/day 5x/wk.  Avoid sugar sweetened drinks (soda, tea, coffee, sports drinks, juices) Limit intake of fried foods and sweets/desserts Eat 3 meals day,  1- 2 snacks a day in afternoon if needed and at bedtime Eat 3 carbohydrate servings/meal + protein Eat 1 carbohydrate serving/snack + protein Space meals 4-6 hours apart Bring blood sugar records to the next appointment/class Call your doctor for a prescription for:  1. Meter strips (type)  One Touch Verio test strips  checking  1 times per day  2. Lancets (type) One Touch Delica lancets  checking  1   times per day Get a Sharps container Return for appointment/classes on: class 1 on 11-09-16   Expected Outcomes:   positive  Education material provided: General meal planning guidelines  If problems or questions,  patient to contact team via: [phone]  Future DSME appointment:  11-09-16

## 2016-11-09 ENCOUNTER — Encounter: Payer: Self-pay | Admitting: Dietician

## 2016-11-09 ENCOUNTER — Encounter: Payer: BC Managed Care – PPO | Admitting: Dietician

## 2016-11-09 VITALS — Ht 68.0 in | Wt 203.7 lb

## 2016-11-09 DIAGNOSIS — E119 Type 2 diabetes mellitus without complications: Secondary | ICD-10-CM

## 2016-11-09 NOTE — Progress Notes (Signed)
Appt. Start Time: 9:00am Appt. End Time: 12:00 pm  Class 1 Diabetes Overview - define DM; state own type of DM; identify functions of pancreas and insulin; define insulin deficiency vs insulin resistance  Psychosocial - identify DM as a source of stress; state the effects of stress on BG control; verbalize appropriate stress management techniques; identify personal stress issues   Nutritional Management - describe effects of food on blood glucose; identify sources of carbohydrate, protein and fat; verbalize the importance of balance meals in controlling blood glucose; identify meals as well balanced or not; estimate servings of carbohydrate from menus; use food labels to identify servings size, content of carbohydrate, fiber, protein, fat, saturated fat and sodium; recognize food sources of fat, saturated fat, trans fat, sodium and verbalize goals for intake; describe healthful appropriate food choices when dining out   Exercise - describe the effects of exercise on blood glucose and importance of regular exercise in controlling diabetes; state a plan for personal exercise; verbalize contraindications for exercise  Self-Monitoring - state importance of HBGM and demo procedure accurately; use HBGM results to effectively manage diabetes; identify importance of regular HbA1C testing and goals for results  Acute Complications/Sick Day Guidelines - recognize hyperglycemia and hypoglycemia with causes and effects; identify blood glucose results as high, low or in control; list steps in treating and preventing high and low blood glucose; state appropriate measure to manage blood glucose when ill (need for meds, HBGM plan, when to call physician, need for fluids)  Chronic Complications/Foot, Skin, Eye Dental Care - identify possible long-term complications of diabetes (retinopathy, neuropathy, nephropathy, cardiovascular disease, infections); explain steps in prevention and treatment of chronic complications;  state importance of daily self-foot exams; describe how to examine feet and what to look for; explain appropriate eye and dental care  Lifestyle Changes/Goals & Health/Community Resources - state benefits of making appropriate lifestyle changes; identify habits that need to change (meals, tobacco, alcohol); identify strategies to reduce risk factors for personal health; set goals for proper diabetes care; state need for and frequency of healthcare follow-up; describe appropriate community resources for good health (ADA, web sites, apps)   Pregnancy/Sexual Health - define gestational diabetes; state importance of good blood glucose control and birth control prior to pregnancy; state importance of good blood glucose control in preventing sexual problems (impotence, vaginal dryness, infections, loss of desire); state relationship of blood glucose control and pregnancy outcome; describe risk of maternal and fetal complications  Teaching Materials Used: Class 1 Slides/Notebook Diabetes Booklet ID Card  Medic Alert/Medic ID Forms Sleep Evaluation Exercise Handout Daily Food Record Planning a Balanced Meal Goals for Class 1 

## 2016-11-23 ENCOUNTER — Ambulatory Visit: Payer: BC Managed Care – PPO

## 2016-11-23 ENCOUNTER — Encounter: Payer: Self-pay | Admitting: *Deleted

## 2016-11-23 NOTE — Progress Notes (Signed)
Pt called today and reported that he was sick and couldn't attend diabetes class - 3. He is scheduled to return for Class 2 on Jan 11 and will reschedule Class 3 at that time.

## 2016-11-28 ENCOUNTER — Other Ambulatory Visit: Payer: Self-pay | Admitting: Internal Medicine

## 2016-11-28 NOTE — Telephone Encounter (Signed)
Pt called and asked if Dr. Lorin PicketScott could switch his BP medication back to the one he had previous. He is on Irbesartan now. He said that it was not lisinopril either. He could not tell me the name of the one that he was one previous. He states it was the only one that kept his bp on the mark.

## 2016-11-29 NOTE — Telephone Encounter (Signed)
Need to know what his blood pressure is running.  He is on the lowest dose of irbesartan (avapro).  May just need to adjust dose of this medication.

## 2016-12-04 ENCOUNTER — Ambulatory Visit: Payer: BC Managed Care – PPO | Admitting: Gastroenterology

## 2016-12-07 ENCOUNTER — Ambulatory Visit: Payer: BC Managed Care – PPO

## 2016-12-27 ENCOUNTER — Other Ambulatory Visit: Payer: Self-pay | Admitting: Internal Medicine

## 2017-01-03 ENCOUNTER — Telehealth: Payer: Self-pay | Admitting: *Deleted

## 2017-01-03 NOTE — Telephone Encounter (Signed)
Pt request call to discuss his back issues. Pt contact [phone]

## 2017-01-03 NOTE — Telephone Encounter (Signed)
N/a no v/m  

## 2017-01-04 ENCOUNTER — Telehealth: Payer: Self-pay | Admitting: *Deleted

## 2017-01-04 ENCOUNTER — Ambulatory Visit: Payer: BC Managed Care – PPO

## 2017-01-04 NOTE — Telephone Encounter (Signed)
Patient left message that he could not attend Diabetes Class 2 today and he would call back to reschedule.

## 2017-01-05 ENCOUNTER — Other Ambulatory Visit: Payer: Self-pay | Admitting: Internal Medicine

## 2017-01-05 NOTE — Telephone Encounter (Signed)
Pt spouse called and stated that they spoke with Dr. Channing Muttersoy in regards to his back issues. He suggested that [facility]. Pt is still having a lot of issues with his lower back. Pt would like Dr. Lorin PicketScott him to discuss further. Please advise, thank you!  Call pt @ [phone]

## 2017-01-05 NOTE — Telephone Encounter (Signed)
Please advise 

## 2017-01-05 NOTE — Telephone Encounter (Signed)
Pt states that he will call Dr. Temple Pacinioy's offices on Monday and get them to fax a letter to our

## 2017-01-05 NOTE — Telephone Encounter (Signed)
I can see him on Tuesday at 9:00 or 10:00 - to discuss.  Please notify Olegario MessierKathy of which appt slot you use.  She is trying to get in touch with someone about the other spot.

## 2017-01-05 NOTE — Telephone Encounter (Signed)
yopu can use 9 or 10 just leave one .

## 2017-01-15 ENCOUNTER — Encounter: Payer: Self-pay | Admitting: Dietician

## 2017-01-15 NOTE — Progress Notes (Signed)
Called pt-pt does not want to reschedule classes 2 and 3 at this time.  Advised pt that his referral is in effect for 1 year and if he decides to return to call us. Pt discharged

## 2017-01-19 NOTE — Telephone Encounter (Signed)
Spoke with patient BP running around 138/78, he states , having left side pain had ultrasound showed fatty liver .  Would like to come in for appointment to discuss this.

## 2017-01-21 NOTE — Telephone Encounter (Signed)
Given concern regarding elevated blood pressure, would recommend increasing avapro to 150mg  q day.  If agreeable, let me know and I will place the order for the rx.  Also, if pain is side, agree with appt.  If agreeable, see if someone can see him this week and then can f/u after

## 2017-01-23 LAB — HM COLONOSCOPY

## 2017-01-26 NOTE — Telephone Encounter (Signed)
Patient advised of below states he will increase Avapro to 150 mg qd.   He will need [facility].  He states Dr Channing Muttersoy reviewed scan and he believe he has arthritis and he would benefit from physical therapy would like to [facility].   Please advise.

## 2017-01-29 MED ORDER — IRBESARTAN 150 MG PO TABS: 150.0000 mg | ORAL_TABLET | Freq: Every day | ORAL | 1 refills | Status: DC

## 2017-01-29 NOTE — Telephone Encounter (Signed)
Spoke with patient advised of below.  He hasn't been officially evaluated by Dr Channing Muttersoy in the office he just spoke with him by phone.

## 2017-01-29 NOTE — Telephone Encounter (Signed)
I have sent in rx for avapro 150mg  q day #30 with one refill.  As per previous phone message, need notes from Dr Channing Muttersoy giving his recommendation and then will refer to physical therapy.

## 2017-01-29 NOTE — Telephone Encounter (Signed)
Left message to call.

## 2017-02-14 ENCOUNTER — Telehealth: Payer: Self-pay | Admitting: Internal Medicine

## 2017-02-14 NOTE — Telephone Encounter (Signed)
I can see him 02/16/17 at 12:00.  (in reviewing the chart, he saw Claris CheMargaret.  She ordered the MRI.  I saw him after the MRI and we discussed results as well.  His back was better and desired no further w/up then - per my 09/2016 note.  I do not mind seeing him and discussing again.).

## 2017-02-14 NOTE — Telephone Encounter (Signed)
Please advise 

## 2017-02-14 NOTE — Telephone Encounter (Signed)
Pt wife called to get a appt for her husband to come in and review MRI results from 09/2016. Pt has not gone over any results. Pt only got a call of what was found. Pt would like to speak to Dr Lorin PicketScott. Please advise?  Call pt @ [phone]. Thank you!

## 2017-02-15 NOTE — Telephone Encounter (Signed)
Pt informed app made had something open next week.

## 2017-02-15 NOTE — Telephone Encounter (Signed)
Please notify him [facility]  Will work in somewhere.  I can get with her.

## 2017-02-15 NOTE — Telephone Encounter (Signed)
Do not call yet I will call

## 2017-02-15 NOTE — Telephone Encounter (Signed)
I did not call due to schedule change let me know if their is another time that you want me to move him to?

## 2017-02-19 ENCOUNTER — Encounter: Payer: Self-pay | Admitting: Internal Medicine

## 2017-02-19 ENCOUNTER — Ambulatory Visit (INDEPENDENT_AMBULATORY_CARE_PROVIDER_SITE_OTHER): Payer: BC Managed Care – PPO | Admitting: Internal Medicine

## 2017-02-19 ENCOUNTER — Ambulatory Visit (INDEPENDENT_AMBULATORY_CARE_PROVIDER_SITE_OTHER): Payer: BC Managed Care – PPO

## 2017-02-19 VITALS — BP 120/68 | HR 73 | Temp 99.8°F | Resp 18 | Ht 68.0 in | Wt 202.8 lb

## 2017-02-19 DIAGNOSIS — M546 Pain in thoracic spine: Secondary | ICD-10-CM | POA: Diagnosis not present

## 2017-02-19 DIAGNOSIS — I10 Essential (primary) hypertension: Secondary | ICD-10-CM | POA: Diagnosis not present

## 2017-02-19 DIAGNOSIS — E7800 Pure hypercholesterolemia, unspecified: Secondary | ICD-10-CM

## 2017-02-19 DIAGNOSIS — E119 Type 2 diabetes mellitus without complications: Secondary | ICD-10-CM | POA: Diagnosis not present

## 2017-02-19 DIAGNOSIS — G8929 Other chronic pain: Secondary | ICD-10-CM | POA: Diagnosis not present

## 2017-02-19 DIAGNOSIS — M5442 Lumbago with sciatica, left side: Secondary | ICD-10-CM | POA: Diagnosis not present

## 2017-02-19 NOTE — Progress Notes (Signed)
Patient ID: Jackson Cooper, male   DOB: 11/23/1966, 51 y.o.   MRN: [id number]   Subjective:    Patient ID: Jackson Cooper, male    DOB: June 08, 1966, 45 y.o.   MRN: [id number]  HPI  Patient here for to discuss persistent back/side pain.  He was seen initially in 08/2016 for left side pain.  See note.  MRI and abdominal ultrasound ordered.  Abdominal ultrasound revealed fatty liver.  MRI with mild DDD with L5-S1 mild right foraminal impingement.  His last visit with me, back was better.  Comes in today stating that he is having more discomfort.  Left side pain and mid back pian.  Pain noted in his left upper abdomen.  Notices more discomfort when he is sitting.  Feels full.  He wears a vest now.  Aggravates.  Discussed with Dr Carloyn Manner.  He reviewed MRI.  Recommended physical therapy.  Again, pain appears to be located in thoracic spine region and extends around to left upper abdomen.  No rash.  Previous itching.  Resolved.  No urine or bowel change.  No sob.  No chest pain.  Brought in no sugar readings.  Not watching his diet as well.  Not exercising as much.     Past Medical History:  Diagnosis Date  . DVT (deep venous thrombosis) ([place]) Diagnosed 9/08  . Encephalitis 1970  . Enlarged prostate   . Hyperglycemia   . Hyperlipidemia   . Hypertension   . Nephrolithiasis    Past Surgical History:  Procedure Laterality Date  . EYE SURGERY  1972   Family History  Problem Relation Age of Onset  . Hypertension Mother   . Breast cancer Sister    Social History   Social History  . Marital status: Married    Spouse name: N/A  . Number of children: N/A  . Years of education: N/A   Social History Main Topics  . Smoking status: Never Smoker  . Smokeless tobacco: Never Used  . Alcohol use No  . Drug use: No  . Sexual activity: Not Asked   Other Topics Concern  . None   Social History Narrative  . None    Outpatient Encounter Prescriptions as of 02/19/2017  Medication Sig  . amLODipine  (NORVASC) 5 MG tablet TAKE 1 TABLET BY MOUTH ONCE DAILY  . aspirin 81 MG tablet Take 81 mg by mouth daily. Take 1 tablet by mouth once a day  . B Complex Vitamins (VITAMIN B-COMPLEX PO) Take 1 tablet by mouth daily.  . cetirizine (ZYRTEC) 10 MG tablet Take 10 mg by mouth daily as needed.   . Flaxseed Oil OIL Take 1,000 mg by mouth daily. 1000 mg   . fluticasone (FLONASE) 50 MCG/ACT nasal spray Place 2 sprays into the nose daily.  . irbesartan (AVAPRO) 150 MG tablet Take 1 tablet (150 mg total) by mouth daily.  Javier Docker Oil 350 MG CAPS Take 1 capsule by mouth daily.  [name] LORazepam (ATIVAN) 0.5 MG tablet Take 0.5 mg by mouth every 8 (eight) hours as needed. 1 tablet as directed   . losartan (COZAAR) 25 MG tablet TAKE 1 TABLET BY MOUTH ONCE DAILY   No facility-administered encounter medications on file as of 02/19/2017.     Review of Systems  Constitutional: Negative for appetite change and unexpected weight change.  HENT: Negative for congestion and sinus pressure.   Respiratory: Negative for cough, chest tightness and shortness of breath.   Cardiovascular: Negative for chest pain,  palpitations and leg swelling.  Gastrointestinal: Negative for diarrhea, nausea and vomiting.       Left upper quadrant pain.  No pain to touch.    Genitourinary: Negative for difficulty urinating and dysuria.  Musculoskeletal: Positive for back pain. Negative for joint swelling and myalgias.  Skin: Negative for color change and rash.  Neurological: Negative for dizziness, light-headedness and headaches.  Psychiatric/Behavioral: Negative for agitation and dysphoric mood.       Objective:     Blood pressure rechecked by me:  120/72  Physical Exam  Constitutional: He appears well-developed and well-nourished. No distress.  HENT:  Nose: Nose normal.  Mouth/Throat: Oropharynx is clear and moist.  Neck: Neck supple.  Cardiovascular: Normal rate and regular rhythm.   Pulmonary/Chest: Effort normal and breath  sounds normal. No respiratory distress.  Abdominal: Soft. Bowel sounds are normal. There is no tenderness.  Musculoskeletal: He exhibits no edema or tenderness.  No pain to palpation over his back or side.    Lymphadenopathy:    He has no cervical adenopathy.  Skin: No rash noted. No erythema.  Psychiatric: He has a normal mood and affect. His behavior is normal.    BP 120/68 (BP Location: Left Arm, Patient Position: Sitting, Cuff Size: Normal)   Pulse 73   Temp 99.8 F (37.7 C) (Oral)   Resp 18   Ht '5\' 8"'  (1.727 m)   Wt 202 lb 12.8 oz (92 kg)   SpO2 96%   BMI 30.84 kg/m  Wt Readings from Last 3 Encounters:  02/19/17 202 lb 12.8 oz (92 kg)  11/09/16 203 lb 11.2 oz (92.4 kg)  10/30/16 204 lb 3.2 oz (92.6 kg)     Lab Results  Component Value Date   WBC 5.9 09/22/2016   HGB 15.3 09/22/2016   HCT 44.9 09/22/2016   PLT 176.0 09/22/2016   GLUCOSE 147 (H) 09/22/2016   CHOL 184 09/22/2016   TRIG 107.0 09/22/2016   HDL 35.20 (L) 09/22/2016   LDLCALC 127 (H) 09/22/2016   ALT 27 09/22/2016   AST 18 09/22/2016   NA 138 09/22/2016   K 4.2 09/22/2016   CL 104 09/22/2016   CREATININE 1.03 09/22/2016   BUN 18 09/22/2016   CO2 27 09/22/2016   TSH 0.80 09/22/2016   PSA 0.67 04/12/2016   HGBA1C 7.6 (H) 09/22/2016   MICROALBUR 1.0 09/13/2015    Mr Lumbar Spine Wo Contrast  Result Date: 09/11/2016 CLINICAL DATA:  Low back pain with radiculopathy for 1 year. EXAM: MRI LUMBAR SPINE WITHOUT CONTRAST TECHNIQUE: Multiplanar, multisequence MR imaging of the lumbar spine was performed. No intravenous contrast was administered. COMPARISON:  Multiple exams, including 04/28/2008 FINDINGS: Segmentation: A transitional lumbosacral vertebra is assumed to represent the S1 level. Careful correlation with this numbering strategy prior to any procedural intervention would be recommended. Rudimentary disc material at the S1- 2 level. Alignment:  No vertebral subluxation is observed. Vertebrae: 1.4 cm  hemangioma posteriorly in the L1 vertebral body. 1.3 cm hemangioma posteriorly in the L3 vertebral body. No significant vertebral marrow edema is identified. Conus medullaris: Extends to the L1 level and appears normal. Paraspinal and other soft tissues: Unremarkable Disc levels: L1-2: Unremarkable. L2- 3: Unremarkable. L3-4:  No impingement.  Mild disc bulge. L4-5:  No impingement.  Mild disc bulge. L5-S1: Mild right foraminal stenosis due to facet arthropathy. Mild disc bulge. S1-2:  No impingement.  Rudimentary disc. IMPRESSION: 1. Mild lumbar spondylosis and degenerative disc disease, causing mild right foraminal impingement at  the L5-S1 level. 2. A transitional lumbosacral vertebra is assumed to represent the S1 level. Careful correlation with this numbering strategy prior to any procedural intervention would be recommended. Electronically Signed   By: Van Clines M.D.   On: 09/11/2016 09:43   US Abdomen Complete  Result Date: 09/11/2016 CLINICAL DATA:  Abdominal pain. EXAM: ABDOMEN ULTRASOUND COMPLETE COMPARISON:  CT 04/28/2008. FINDINGS: Gallbladder: No gallstones or wall thickening visualized. No sonographic Murphy sign noted by sonographer. Common bile duct: Diameter: 2.5 mm Liver: Liver has a heterogeneous echotexture suggesting fatty infiltration and/or hepatocellular disease. An area of focal fatty sparing adjacent to gallbladder fossa may be present. IVC: No abnormality visualized. Pancreas: Visualized portion unremarkable. Spleen: Size and appearance within normal limits. Right Kidney: Length: 12.1 cm. Echogenicity within normal limits. No mass or hydronephrosis visualized. Left Kidney: Length: 11.8 cm. Echogenicity within normal limits. No mass or hydronephrosis visualized. Abdominal aorta: No aneurysm visualized. Other findings: None. IMPRESSION: Liver has a heterogeneous echotexture suggesting fatty infiltration and/or hepatocellular disease. An area of focal fatty sparing adjacent to  the gallbladder fossa may be present . Exam otherwise unremarkable. Electronically Signed   By: Marcello Moores  Register   On: 09/11/2016 08:28       Assessment & Plan:   Problem List Items Addressed This Visit    Diabetes mellitus ([place])    Low carb diet and exercise.  Discussed with him today.  Check met b and a1c.        Relevant Orders   Hemoglobin M4B   Basic metabolic panel   Microalbumin / creatinine urine ratio   Essential hypertension, benign    Blood pressure under good control.  Continue same medication regimen. Follow pressures.  Follow metabolic panel.  Doing well on current medication.        Hypercholesterolemia    Low cholesterol diet and exercise.  Follow lipid panel.        Relevant Orders   Lipid panel   Hepatic function panel   Low back pain    Went over MRI results.  Discussed his call to Dr Carloyn Manner. Pain more localized to thoracic region and extends around.  No rash.  No itching.  Abdominal ultrasound as outlined.  Worse with certain positions.  Better with standing and moving.  Discussed further w/up.  Discussed therapy.  Check thoracic spine xray.  Refer to PT.  Desires no further testing or intervention.         Other Visit Diagnoses    Left-sided thoracic back pain, unspecified chronicity    -  Primary   Relevant Orders   DG Thoracic Spine 2 View (Completed)   Ambulatory referral to Physical Therapy       Einar Pheasant, MD

## 2017-02-19 NOTE — Progress Notes (Signed)
Pre-visit discussion using our clinic review tool. No additional management support is needed unless otherwise documented below in the visit note.  

## 2017-02-20 ENCOUNTER — Telehealth: Payer: Self-pay

## 2017-02-20 NOTE — Telephone Encounter (Signed)
-----   Message from Dale Durhamharlene Scott, MD sent at 02/20/2017  5:50 AM EDT ----- Notify pt that his back xray reveals no fracture.  Alignment is normal.  No other significant bone abnormalities noted.

## 2017-02-20 NOTE — Telephone Encounter (Signed)
Patient informed no questions at this time.   

## 2017-02-20 NOTE — Telephone Encounter (Signed)
Left message to return call to our office.  

## 2017-02-20 NOTE — Telephone Encounter (Signed)
Please call pt at [phone]

## 2017-02-23 ENCOUNTER — Encounter: Payer: Self-pay | Admitting: Internal Medicine

## 2017-02-23 NOTE — Assessment & Plan Note (Signed)
Low cholesterol diet and exercise.  Follow lipid panel.   

## 2017-02-23 NOTE — Assessment & Plan Note (Signed)
Low carb diet and exercise.  Discussed with him today.  Check met b and a1c.

## 2017-02-23 NOTE — Assessment & Plan Note (Signed)
Went over MRI results.  Discussed his call to Dr Channing Mutters. Pain more localized to thoracic region and extends around.  No rash.  No itching.  Abdominal ultrasound as outlined.  Worse with certain positions.  Better with standing and moving.  Discussed further w/up.  Discussed therapy.  Check thoracic spine xray.  Refer to PT.  Desires no further testing or intervention.

## 2017-02-23 NOTE — Assessment & Plan Note (Addendum)
Blood pressure under good control.  Continue same medication regimen. Follow pressures.  Follow metabolic panel.  Doing well on current medication.

## 2017-02-26 ENCOUNTER — Encounter: Payer: Self-pay | Admitting: Internal Medicine

## 2017-02-26 DIAGNOSIS — Z0000 Encounter for general adult medical examination without abnormal findings: Secondary | ICD-10-CM | POA: Insufficient documentation

## 2017-03-05 ENCOUNTER — Telehealth: Payer: Self-pay | Admitting: Internal Medicine

## 2017-03-05 DIAGNOSIS — R1012 Left upper quadrant pain: Secondary | ICD-10-CM

## 2017-03-05 DIAGNOSIS — M546 Pain in thoracic spine: Secondary | ICD-10-CM

## 2017-03-05 NOTE — Telephone Encounter (Signed)
Came in with wife to her appt.  States persistent pain left upper quadrant and left side.  Wants CT scheduled.  Will schedule CT abdomen.  See previous office note.

## 2017-03-09 ENCOUNTER — Other Ambulatory Visit (INDEPENDENT_AMBULATORY_CARE_PROVIDER_SITE_OTHER): Payer: BC Managed Care – PPO

## 2017-03-09 DIAGNOSIS — E7800 Pure hypercholesterolemia, unspecified: Secondary | ICD-10-CM

## 2017-03-09 DIAGNOSIS — E119 Type 2 diabetes mellitus without complications: Secondary | ICD-10-CM | POA: Diagnosis not present

## 2017-03-09 LAB — HEPATIC FUNCTION PANEL
ALK PHOS: 52 U/L (ref 39–117)
ALT: 22 U/L (ref 0–53)
AST: 15 U/L (ref 0–37)
Albumin: 4.5 g/dL (ref 3.5–5.2)
BILIRUBIN DIRECT: 0.2 mg/dL (ref 0.0–0.3)
TOTAL PROTEIN: 6.8 g/dL (ref 6.0–8.3)
Total Bilirubin: 1 mg/dL (ref 0.2–1.2)

## 2017-03-09 LAB — MICROALBUMIN / CREATININE URINE RATIO
Creatinine,U: 102.3 mg/dL
Microalb Creat Ratio: 0.7 mg/g (ref 0.0–30.0)
Microalb, Ur: <0.7 mg/dL (ref 0.0–1.9)

## 2017-03-09 LAB — BASIC METABOLIC PANEL
BUN: 17 mg/dL (ref 6–23)
CALCIUM: 9.5 mg/dL (ref 8.4–10.5)
CO2: 28 mEq/L (ref 19–32)
CREATININE: 1.02 mg/dL (ref 0.40–1.50)
Chloride: 103 mEq/L (ref 96–112)
GFR: 81.33 mL/min (ref >60.00)
GLUCOSE: 160 mg/dL — AB (ref 70–99)
Potassium: 4.6 mEq/L (ref 3.5–5.1)
Sodium: 138 mEq/L (ref 135–145)

## 2017-03-09 LAB — LIPID PANEL
CHOLESTEROL: 186 mg/dL (ref 0–200)
HDL: 35.7 mg/dL — ABNORMAL LOW (ref >39.00)
LDL Cholesterol: 127 mg/dL — ABNORMAL HIGH (ref 0–99)
NonHDL: 150.35
TRIGLYCERIDES: 116 mg/dL (ref 0.0–149.0)
Total CHOL/HDL Ratio: 5
VLDL: 23.2 mg/dL (ref 0.0–40.0)

## 2017-03-09 LAB — HEMOGLOBIN A1C: Hgb A1c MFr Bld: 8 % — ABNORMAL HIGH (ref 4.6–6.5)

## 2017-03-12 ENCOUNTER — Ambulatory Visit
Admission: RE | Admit: 2017-03-12 | Discharge: 2017-03-12 | Disposition: A | Discharge status: Home / Self Care | Payer: BC Managed Care – PPO | Source: Ambulatory Visit | Attending: Internal Medicine | Admitting: Internal Medicine

## 2017-03-12 ENCOUNTER — Telehealth: Payer: Self-pay

## 2017-03-12 DIAGNOSIS — M546 Pain in thoracic spine: Secondary | ICD-10-CM | POA: Insufficient documentation

## 2017-03-12 DIAGNOSIS — R1012 Left upper quadrant pain: Secondary | ICD-10-CM

## 2017-03-12 DIAGNOSIS — E7800 Pure hypercholesterolemia, unspecified: Secondary | ICD-10-CM

## 2017-03-12 DIAGNOSIS — K5790 Diverticulosis of intestine, part unspecified, without perforation or abscess without bleeding: Secondary | ICD-10-CM | POA: Diagnosis not present

## 2017-03-12 DIAGNOSIS — K760 Fatty (change of) liver, not elsewhere classified: Secondary | ICD-10-CM | POA: Diagnosis not present

## 2017-03-12 LAB — POCT I-STAT CREATININE: CREATININE: 0.9 mg/dL (ref 0.61–1.24)

## 2017-03-12 MED ORDER — ROSUVASTATIN CALCIUM 20 MG PO TABS: 20.0000 mg | ORAL_TABLET | Freq: Every day | ORAL | 1 refills | Status: DC

## 2017-03-12 MED ORDER — METFORMIN HCL 500 MG PO TABS: 500.0000 mg | ORAL_TABLET | Freq: Two times a day (BID) | ORAL | 1 refills | Status: DC

## 2017-03-12 MED ORDER — IOPAMIDOL (ISOVUE-300) INJECTION 61%
100.0000 mL | Freq: Once | INTRAVENOUS | Status: AC | PRN
  Administered 2017-03-12: 100 mL via INTRAVENOUS

## 2017-03-12 NOTE — Telephone Encounter (Signed)
-----   Message from Dale [place], MD sent at 03/10/2017 10:14 AM EDT ----- Notify pt that his cholesterol is elevated.  Given his risk factors with diabetes and hypertension, he needs to start a cholesterol medication.  I would like to start him on crestor  q day.  Will need liver panel checked 6 weeks after starting the medication.  a1c is elevated.  (8.0).  Increased more than last check.  Needs to start medication.  Start metformin  bid.  Needs to check and record blood sugar and send in readings over the next few weeks.  Kidney function tests and liver function tests are wnl.

## 2017-03-12 NOTE — Telephone Encounter (Signed)
Placed on schedule he for labwork  04/25/17.

## 2017-03-12 NOTE — Telephone Encounter (Addendum)
Patient advised of below and verbalized an understanding.  He is scheduled for appointment on 04/15/17 . He is wondering if he can just wait until then to get Live rpanel  checked then . Please advise.

## 2017-03-12 NOTE — Telephone Encounter (Signed)
Discussed with Baxter Hire.  Pt agreeable to starting metformin and crestor.  rx sent in for metformin #60 with one refill and crestor #30 with one refill.  Ok to wait until may appt to recheck liver test.  He has appt 04/25/17.  This will be roughly 6 weeks.  Please place on schedule that he needs liver panel checked.   Thanks

## 2017-03-12 NOTE — Telephone Encounter (Signed)
Left message to return call to our office.  

## 2017-03-13 ENCOUNTER — Encounter: Payer: Self-pay | Admitting: Internal Medicine

## 2017-03-13 ENCOUNTER — Telehealth: Payer: Self-pay

## 2017-03-13 NOTE — Telephone Encounter (Signed)
Left message to return call to our office.  

## 2017-03-13 NOTE — Telephone Encounter (Signed)
-----   Message from Dale [place], MD sent at 03/13/2017  6:02 AM EDT ----- Notify pt that CT scan reveals fatty liver and diverticulosis.  No diverticulitis.  No acute abnormality.

## 2017-03-13 NOTE — Telephone Encounter (Signed)
Patient informed did not have any questions.  

## 2017-03-15 ENCOUNTER — Encounter: Payer: Self-pay | Admitting: Emergency Medicine

## 2017-03-15 ENCOUNTER — Emergency Department
Admission: EM | Admit: 2017-03-15 | Discharge: 2017-03-15 | Disposition: A | Discharge status: Home / Self Care | Payer: BC Managed Care – PPO | Attending: Emergency Medicine | Admitting: Emergency Medicine

## 2017-03-15 ENCOUNTER — Emergency Department: Payer: BC Managed Care – PPO

## 2017-03-15 DIAGNOSIS — Z7982 Long term (current) use of aspirin: Secondary | ICD-10-CM | POA: Diagnosis not present

## 2017-03-15 DIAGNOSIS — Z7984 Long term (current) use of oral hypoglycemic drugs: Secondary | ICD-10-CM | POA: Insufficient documentation

## 2017-03-15 DIAGNOSIS — I10 Essential (primary) hypertension: Secondary | ICD-10-CM | POA: Insufficient documentation

## 2017-03-15 DIAGNOSIS — Z79899 Other long term (current) drug therapy: Secondary | ICD-10-CM | POA: Diagnosis not present

## 2017-03-15 DIAGNOSIS — M79605 Pain in left leg: Secondary | ICD-10-CM

## 2017-03-15 DIAGNOSIS — E119 Type 2 diabetes mellitus without complications: Secondary | ICD-10-CM | POA: Diagnosis not present

## 2017-03-15 HISTORY — DX: Type 2 diabetes mellitus without complications: E11.9

## 2017-03-15 LAB — CBC
HCT: 46.7 % (ref 40.0–52.0)
Hemoglobin: 16.2 g/dL (ref 13.0–18.0)
MCH: 30.7 pg (ref 26.0–34.0)
MCHC: 34.6 g/dL (ref 32.0–36.0)
MCV: 88.8 fL (ref 80.0–100.0)
Platelets: 189 K/uL (ref 150–440)
RBC: 5.25 MIL/uL (ref 4.40–5.90)
RDW: 13.6 % (ref 11.5–14.5)
WBC: 7.5 K/uL (ref 3.8–10.6)

## 2017-03-15 LAB — BASIC METABOLIC PANEL
ANION GAP: 6 (ref 5–15)
BUN: 21 mg/dL — AB (ref 6–20)
CALCIUM: 9.4 mg/dL (ref 8.9–10.3)
CHLORIDE: 104 mmol/L (ref 101–111)
CO2: 27 mmol/L (ref 22–32)
CREATININE: 1.14 mg/dL (ref 0.61–1.24)
GFR calc Af Amer: >60 mL/min (ref >60)
GFR calc non Af Amer: >60 mL/min (ref >60)
GLUCOSE: 145 mg/dL — AB (ref 65–99)
POTASSIUM: 4.1 mmol/L (ref 3.5–5.1)
Sodium: 137 mmol/L (ref 135–145)

## 2017-03-15 LAB — TROPONIN I: Troponin I: <0.03 ng/mL

## 2017-03-15 NOTE — Discharge Instructions (Signed)
Your ultrasound today was negative for DVT. Follow-up with your doctor within 1 week for reassessment. If your symptoms have not resolved, you should have a repeat ultrasound in a week.

## 2017-03-15 NOTE — ED Triage Notes (Signed)
Pt via pov from home with leg (calf) and foot pain x 3-4 days. States he has hx of blood clot in leg and this feels similar. Pulses present in left foot, slightly cooler than right foot. Pt alert & oriented with NAD noted.

## 2017-03-15 NOTE — ED Provider Notes (Signed)
[facility] Emergency Department Provider Note  ____________________________________________  Time seen: Approximately 12:29 PM  I have reviewed the triage vital signs and the nursing notes.   HISTORY  Chief Complaint Leg Pain    HPI Jackson Cooper is a 51 y.o. male who complains of left calf and foot pain for the past 3-4 days. This feels like a previous DVT he had in the left leg in 2008. Over the past 1-2 weeks he drove to [facility] and then participated in a motorcycle rally and then drove back. He feels like he is been more sedentary than usual. Denies any fevers chills chest pain or shortness of breath. Reports having a hematological workup in 2008 which was negative for any underlying clotting disorder. Denies any recent injuries. No hospitalization or surgeries.  Leg pain is worse when he stands or walks on the foot and leg.  Was recently diagnosed with diabetes and hyperlipidemia which is currently diet controlled.     Past Medical History:  Diagnosis Date  . Diabetes mellitus without complication (HCC)   . DVT (deep venous thrombosis) (HCC) Diagnosed 9/08  . Encephalitis 1970  . Enlarged prostate   . Hyperglycemia   . Hyperlipidemia   . Hypertension   . Nephrolithiasis      Patient Active Problem List   Diagnosis Date Noted  . Healthcare maintenance 02/26/2017  . Low back pain 08/30/2016  . Memory changes 06/27/2016  . Stress 09/13/2015  . Essential hypertension, benign 05/25/2014  . Abnormal EKG 05/25/2014  . Diabetes mellitus (HCC) 02/09/2013  . History of DVT (deep vein thrombosis) 02/09/2013  . Nephrolithiasis 02/09/2013  . Hypercholesterolemia 02/09/2013     Past Surgical History:  Procedure Laterality Date  . EYE SURGERY  1972     Prior to Admission medications   Medication Sig Start Date End Date Taking? Authorizing Provider  amLODipine (NORVASC) 5 MG tablet TAKE 1 TABLET BY MOUTH ONCE DAILY 01/05/17    Dale Ailey, MD  aspirin 81 MG tablet Take 81 mg by mouth daily. Take 1 tablet by mouth once a day    Historical Provider, MD  B Complex Vitamins (VITAMIN B-COMPLEX PO) Take 1 tablet by mouth daily.    Historical Provider, MD  cetirizine (ZYRTEC) 10 MG tablet Take 10 mg by mouth daily as needed.     Historical Provider, MD  Flaxseed Oil OIL Take 1,000 mg by mouth daily. 1000 mg     Historical Provider, MD  fluticasone (FLONASE) 50 MCG/ACT nasal spray Place 2 sprays into the nose daily. 02/21/13   Dale [place], MD  irbesartan (AVAPRO) 150 MG tablet Take 1 tablet (150 mg total) by mouth daily. 01/29/17   Dale Ulysses, MD  Krill Oil 350 MG CAPS Take 1 capsule by mouth daily.    Historical Provider, MD  LORazepam (ATIVAN) 0.5 MG tablet Take 0.5 mg by mouth every 8 (eight) hours as needed. 1 tablet as directed     Historical Provider, MD  losartan (COZAAR) 25 MG tablet TAKE 1 TABLET BY MOUTH ONCE DAILY 01/05/17   Dale [place], MD  metFORMIN (GLUCOPHAGE) 500 MG tablet Take 1 tablet (500 mg total) by mouth 2 (two) times daily with a meal. 03/12/17   Dale [place], MD  rosuvastatin (CRESTOR) 20 MG tablet Take 1 tablet (20 mg total) by mouth daily. 03/12/17   Dale [place], MD     Allergies Patient has no known allergies.   Family History  Problem Relation Age of  Onset  . Hypertension Mother   . Breast cancer Sister     Social History Social History  Substance Use Topics  . Smoking status: Never Smoker  . Smokeless tobacco: Never Used  . Alcohol use No    Review of Systems  Constitutional:   No fever or chills.  ENT:   No sore throat. No rhinorrhea. Cardiovascular:   No chest pain. Respiratory:   No dyspnea or cough. Gastrointestinal:   Negative for abdominal pain, vomiting and diarrhea.  Genitourinary:   Negative for dysuria or difficulty urinating. Musculoskeletal:   Left leg pain as above Neurological:   Negative for headaches 10-point ROS otherwise  negative.  ____________________________________________   PHYSICAL EXAM:  VITAL SIGNS: ED Triage Vitals [03/15/17 1003]  Enc Vitals Group     BP 123/76     Pulse Rate 71     Resp 18     Temp 98.5 F (36.9 C)     Temp Source Oral     SpO2 98 %     Weight 199 lb (90.3 kg)     Height 5' 8.5" (1.74 m)     Head Circumference      Peak Flow      Pain Score 3     Pain Loc      Pain Edu?      Excl. in GC?     Vital signs reviewed, nursing assessments reviewed.   Constitutional:   Alert and oriented. Well appearing and in no distress. Eyes:   No scleral icterus. No conjunctival pallor. PERRL. EOMI.  No nystagmus. ENT   Head:   Normocephalic and atraumatic.   Nose:   No congestion/rhinnorhea. No septal hematoma   Mouth/Throat:   MMM, no pharyngeal erythema. No peritonsillar mass.    Neck:   No stridor. No SubQ emphysema. No meningismus. Hematological/Lymphatic/Immunilogical:   No cervical lymphadenopathy. Cardiovascular:   RRR. Symmetric bilateral radial and DP pulses.  No murmurs.  Respiratory:   Normal respiratory effort without tachypnea nor retractions. Breath sounds are clear and equal bilaterally. No wheezes/rales/rhonchi. Gastrointestinal:   Soft and nontender. Non distended. There is no CVA tenderness.  No rebound, rigidity, or guarding. Genitourinary:   deferred Musculoskeletal:   Normal range of motion in all extremities. No joint effusions.  Mild tenderness in the left calf in the muscle belly. No palpable cord. Intact Achilles tendon function. No bony point tenderness. Other extremities unremarkable. No erythema or induration or swelling. Symmetric Circumference. Neurologic:   Normal speech and language.  CN 2-10 normal. Motor grossly intact. No gross focal neurologic deficits are appreciated.  Skin:    Skin is warm, dry and intact. No rash noted.  No petechiae, purpura, or bullae.  ____________________________________________    LABS (pertinent  positives/negatives) (all labs ordered are listed, but only abnormal results are displayed) Labs Reviewed  BASIC METABOLIC PANEL - Abnormal; Notable for the following:       Result Value   Glucose, Bld 145 (*)    BUN 21 (*)    All other components within normal limits  CBC  TROPONIN I   ____________________________________________   EKG    ____________________________________________    RADIOLOGY  US Venous Img Lower Unilateral Left  Result Date: 03/15/2017 CLINICAL DATA:  51 year old male with left lower extremity pain, recent travel. Personal history of left lower extremity DVT in 2008. EXAM: LEFT LOWER EXTREMITY VENOUS DOPPLER ULTRASOUND TECHNIQUE: Gray-scale sonography with graded compression, as well as color Doppler and duplex ultrasound were  performed to evaluate the lower extremity deep venous systems from the level of the common femoral vein and including the common femoral, femoral, profunda femoral, popliteal and calf veins including the posterior tibial, peroneal and gastrocnemius veins when visible. The superficial great saphenous vein was also interrogated. Spectral Doppler was utilized to evaluate flow at rest and with distal augmentation maneuvers in the common femoral, femoral and popliteal veins. COMPARISON:  04/28/2008 and earlier FINDINGS: Contralateral Common Femoral Vein: Respiratory phasicity is normal and symmetric with the symptomatic side. No evidence of thrombus. Normal compressibility. Common Femoral Vein: No evidence of thrombus. Normal compressibility, respiratory phasicity and response to augmentation. Saphenofemoral Junction: No evidence of thrombus. Normal compressibility and flow on color Doppler imaging. Profunda Femoral Vein: No evidence of thrombus. Normal compressibility and flow on color Doppler imaging. Femoral Vein: No evidence of thrombus. Normal compressibility, respiratory phasicity and response to augmentation. Popliteal Vein: No evidence of  thrombus. Normal compressibility, respiratory phasicity and response to augmentation. Calf Veins: No evidence of thrombus. Normal compressibility and flow on color Doppler imaging. Superficial Great Saphenous Vein: No evidence of thrombus. Normal compressibility and flow on color Doppler imaging. Venous Reflux:  None. Other Findings:  None. IMPRESSION: No evidence of left lower extremity deep venous thrombosis. Electronically Signed   By: Odessa Fleming M.D.   On: 03/15/2017 12:13    ____________________________________________   PROCEDURES Procedures  ____________________________________________   INITIAL IMPRESSION / ASSESSMENT AND PLAN / ED COURSE  Pertinent labs & imaging results that were available during my care of the patient were reviewed by me and considered in my medical decision making (see chart for details).  Patient presents with left leg pain that reminds him of his DVT pain from 2008. He does have a slight risk factors and some recent car travel. No inflammatory changes, no exam findings to suggest DVT. Ultrasound is negative. Given his symptoms and history, I counseled him extensively on monitoring his symptoms closely following up with primary care and having a repeat ultrasound within a week if his symptoms do not resolve. Precautions given for any new symptoms including chest pain shortness of breath or dizziness. At this point with negative exam and negative workup, would not start the patient on anticoagulation. No evidence of fracture or soft tissue infection. Most likely muscular skeletal pain from overuse related to motorcycle riding. Follow-up with primary care.         ____________________________________________   FINAL CLINICAL IMPRESSION(S) / ED DIAGNOSES  Final diagnoses:  Left leg pain      New Prescriptions   No medications on file     Portions of this note were generated with dragon dictation software. Dictation errors may occur despite best  attempts at proofreading.    Sharman Cheek, MD 03/15/17 1235

## 2017-03-15 NOTE — ED Notes (Signed)
Pt in [place]. Wife in room. No needs.

## 2017-03-22 ENCOUNTER — Other Ambulatory Visit: Payer: Self-pay | Admitting: Internal Medicine

## 2017-04-19 ENCOUNTER — Other Ambulatory Visit: Payer: Self-pay | Admitting: Internal Medicine

## 2017-04-25 ENCOUNTER — Other Ambulatory Visit: Payer: BC Managed Care – PPO

## 2017-04-25 ENCOUNTER — Ambulatory Visit (INDEPENDENT_AMBULATORY_CARE_PROVIDER_SITE_OTHER): Payer: BC Managed Care – PPO | Admitting: Internal Medicine

## 2017-04-25 ENCOUNTER — Encounter: Payer: Self-pay | Admitting: Internal Medicine

## 2017-04-25 VITALS — BP 110/68 | HR 56 | Temp 98.6°F | Resp 12 | Ht 69.0 in | Wt 193.8 lb

## 2017-04-25 DIAGNOSIS — Z86718 Personal history of other venous thrombosis and embolism: Secondary | ICD-10-CM | POA: Diagnosis not present

## 2017-04-25 DIAGNOSIS — R002 Palpitations: Secondary | ICD-10-CM

## 2017-04-25 DIAGNOSIS — H00012 Hordeolum externum right lower eyelid: Secondary | ICD-10-CM

## 2017-04-25 DIAGNOSIS — R252 Cramp and spasm: Secondary | ICD-10-CM

## 2017-04-25 DIAGNOSIS — E119 Type 2 diabetes mellitus without complications: Secondary | ICD-10-CM

## 2017-04-25 DIAGNOSIS — E7800 Pure hypercholesterolemia, unspecified: Secondary | ICD-10-CM

## 2017-04-25 DIAGNOSIS — I10 Essential (primary) hypertension: Secondary | ICD-10-CM | POA: Diagnosis not present

## 2017-04-25 LAB — BASIC METABOLIC PANEL WITH GFR
BUN: 18 mg/dL (ref 6–23)
CO2: 27 meq/L (ref 19–32)
Calcium: 9.3 mg/dL (ref 8.4–10.5)
Chloride: 106 meq/L (ref 96–112)
Creatinine, Ser: 1 mg/dL (ref 0.40–1.50)
GFR: 83.17 mL/min
Glucose, Bld: 113 mg/dL — ABNORMAL HIGH (ref 70–99)
Potassium: 4 meq/L (ref 3.5–5.1)
Sodium: 138 meq/L (ref 135–145)

## 2017-04-25 LAB — MAGNESIUM: MAGNESIUM: 2.1 mg/dL (ref 1.5–2.5)

## 2017-04-25 LAB — CK: Total CK: 148 U/L (ref 7–232)

## 2017-04-25 LAB — SEDIMENTATION RATE: Sed Rate: 6 mm/h (ref 0–20)

## 2017-04-25 NOTE — Progress Notes (Signed)
Patient ID: Jackson Cooper, male   DOB: [birth year], 51 y.o.   MRN: [id number]   Subjective:    Patient ID: Jackson Cooper, male    DOB: September 22, 1966, 51 y.o.   MRN: [id number]  HPI  Patient here for a scheduled follow up.  He is accompanied by his wife.  History obtained from both of them.  He has adjusted his diet.  Lost weight.  Stays physically active.  Not doing a lot of formal exercise.  Sugars reviewed.  AM sugars 100-120s and pm sugars 120-180.  (recently 100-130).  Overall feels better.  No chest pain.  No sob.  No acid reflux.  No abdominal pain.  Bowels moving.  Recently evaluated in ER for left leg pain.  Note reviewed.  Ultrasound negative for DVT.  No pain now.  Does have cramps at night.  Discussed the need to stay hydrated.  No unusual activity.  Does have right eye sty.     Past Medical History:  Diagnosis Date  . Diabetes mellitus without complication ([place])   . DVT (deep venous thrombosis) ([place]) Diagnosed 9/08  . Encephalitis 1970  . Enlarged prostate   . Hyperglycemia   . Hyperlipidemia   . Hypertension   . Nephrolithiasis    Past Surgical History:  Procedure Laterality Date  . EYE SURGERY  1972   Family History  Problem Relation Age of Onset  . Hypertension Mother   . Breast cancer Sister    Social History   Social History  . Marital status: Married    Spouse name: N/A  . Number of children: N/A  . Years of education: N/A   Social History Main Topics  . Smoking status: Never Smoker  . Smokeless tobacco: Never Used  . Alcohol use No  . Drug use: No  . Sexual activity: Not Asked   Other Topics Concern  . None   Social History Narrative  . None    Outpatient Encounter Prescriptions as of 04/25/2017  Medication Sig  . amLODipine (NORVASC) 5 MG tablet TAKE 1 TABLET BY MOUTH ONCE DAILY  . aspirin 81 MG tablet Take 81 mg by mouth daily. Take 1 tablet by mouth once a day  . B Complex Vitamins (VITAMIN B-COMPLEX PO) Take 1 tablet by mouth daily.  .  cetirizine (ZYRTEC) 10 MG tablet Take 10 mg by mouth daily as needed.   . Flaxseed Oil OIL Take 1,000 mg by mouth daily. 1000 mg   . fluticasone (FLONASE) 50 MCG/ACT nasal spray Place 2 sprays into the nose daily.  . irbesartan (AVAPRO) 150 MG tablet TAKE 1 TABLET BY MOUTH ONCE DAILY  . Krill Oil 350 MG CAPS Take 1 capsule by mouth daily.  [name] LORazepam (ATIVAN) 0.5 MG tablet Take 0.5 mg by mouth every 8 (eight) hours as needed. 1 tablet as directed   . losartan (COZAAR) 25 MG tablet TAKE 1 TABLET BY MOUTH ONCE DAILY  . rosuvastatin (CRESTOR) 20 MG tablet Take 1 tablet (20 mg total) by mouth daily.  . [DISCONTINUED] metFORMIN (GLUCOPHAGE) 500 MG tablet Take 1 tablet (500 mg total) by mouth 2 (two) times daily with a meal.   No facility-administered encounter medications on file as of 04/25/2017.     Review of Systems  Constitutional: Negative for appetite change and unexpected weight change.  HENT: Negative for congestion and sinus pressure.   Eyes: Negative for discharge and itching.  Respiratory: Negative for cough, chest tightness and shortness of breath.   Cardiovascular:  Negative for chest pain, palpitations and leg swelling.  Gastrointestinal: Negative for abdominal pain, diarrhea, nausea and vomiting.  Genitourinary: Negative for difficulty urinating and dysuria.  Musculoskeletal: Negative for back pain.       Leg cramps as outlined.    Skin: Negative for color change and rash.  Neurological: Negative for dizziness, light-headedness and headaches.  Psychiatric/Behavioral: Negative for agitation and dysphoric mood.       Objective:     Blood pressure rechecked by me;  122/68  Physical Exam  Constitutional: He appears well-developed and well-nourished. No distress.  HENT:  Nose: Nose normal.  Mouth/Throat: Oropharynx is clear and moist.  Eyes:  Right eye - lower lid - two separate lesions - erythema.   Neck: Neck supple. No thyromegaly present.  Cardiovascular: Normal rate  and regular rhythm.   Appears to be regular with premature beat.    Pulmonary/Chest: Effort normal and breath sounds normal. No respiratory distress.  Abdominal: Soft. Bowel sounds are normal. There is no tenderness.  Musculoskeletal: He exhibits no edema or tenderness.  Lymphadenopathy:    He has no cervical adenopathy.  Skin: No rash noted. No erythema.  Psychiatric: He has a normal mood and affect. His behavior is normal.    BP 110/68 (BP Location: Left Arm, Patient Position: Sitting, Cuff Size: Normal)   Pulse (!) 56   Temp 98.6 F (37 C) (Oral)   Resp 12   Ht _0  (1.753 m)   Wt 193 lb 12.8 oz (87.9 kg)   SpO2 97%   BMI 28.62 kg/m  Wt Readings from Last 3 Encounters:  04/25/17 193 lb 12.8 oz (87.9 kg)  03/15/17 199 lb (90.3 kg)  02/19/17 202 lb 12.8 oz (92 kg)     Lab Results  Component Value Date   WBC 7.5 03/15/2017   HGB 16.2 03/15/2017   HCT 46.7 03/15/2017   PLT 189 03/15/2017   GLUCOSE 113 (H) 04/25/2017   CHOL 186 03/09/2017   TRIG 116.0 03/09/2017   HDL 35.70 (L) 03/09/2017   LDLCALC 127 (H) 03/09/2017   ALT 22 03/09/2017   AST 15 03/09/2017   NA 138 04/25/2017   K 4.0 04/25/2017   CL 106 04/25/2017   CREATININE 1.00 04/25/2017   BUN 18 04/25/2017   CO2 27 04/25/2017   TSH 0.80 09/22/2016   PSA 0.67 04/12/2016   HGBA1C 8.0 (H) 03/09/2017   MICROALBUR <0.7 03/09/2017    US Venous Img Lower Unilateral Left  Result Date: 03/15/2017 CLINICAL DATA:  51 year old male with left lower extremity pain, recent travel. Personal history of left lower extremity DVT in 2008. EXAM: LEFT LOWER EXTREMITY VENOUS DOPPLER ULTRASOUND TECHNIQUE: Gray-scale sonography with graded compression, as well as color Doppler and duplex ultrasound were performed to evaluate the lower extremity deep venous systems from the level of the common femoral vein and including the common femoral, femoral, profunda femoral, popliteal and calf veins including the posterior tibial, peroneal  and gastrocnemius veins when visible. The superficial great saphenous vein was also interrogated. Spectral Doppler was utilized to evaluate flow at rest and with distal augmentation maneuvers in the common femoral, femoral and popliteal veins. COMPARISON:  04/28/2008 and earlier FINDINGS: Contralateral Common Femoral Vein: Respiratory phasicity is normal and symmetric with the symptomatic side. No evidence of thrombus. Normal compressibility. Common Femoral Vein: No evidence of thrombus. Normal compressibility, respiratory phasicity and response to augmentation. Saphenofemoral Junction: No evidence of thrombus. Normal compressibility and flow on color Doppler imaging. Profunda Femoral  Vein: No evidence of thrombus. Normal compressibility and flow on color Doppler imaging. Femoral Vein: No evidence of thrombus. Normal compressibility, respiratory phasicity and response to augmentation. Popliteal Vein: No evidence of thrombus. Normal compressibility, respiratory phasicity and response to augmentation. Calf Veins: No evidence of thrombus. Normal compressibility and flow on color Doppler imaging. Superficial Great Saphenous Vein: No evidence of thrombus. Normal compressibility and flow on color Doppler imaging. Venous Reflux:  None. Other Findings:  None. IMPRESSION: No evidence of left lower extremity deep venous thrombosis. Electronically Signed   By: Genevie Ann M.D.   On: 03/15/2017 12:13       Assessment & Plan:   Problem List Items Addressed This Visit    Diabetes mellitus without complication ([place])    Never started the metformin.  Has adjusted his diet.  Lost weight.  Sugars better as outlined.  Follow met b and a1c.  Needs to keep up to date with eye exams.  States was just evaluated a few months ago.        Relevant Orders   Basic metabolic panel (Completed)   Hemoglobin A1c   Hepatic function panel   Essential hypertension, benign    Blood pressure under good control.  Continue same medication  regimen.  Follow pressures.  Follow metabolic panel.        Relevant Orders   Basic metabolic panel   History of DVT (deep vein thrombosis)    Previously treated with coumadin.  Recent leg pain.  ER evaluation as outlined.  Ultrasound negative for DVT.  No pain now.        Hypercholesterolemia    Low cholesterol diet and exercise.  On crestor.  Follow lipid panel and liver function tests.        Relevant Orders   Lipid panel   Palpitations - Primary    Notices occasional palpitations.  No chest pain.  No sob.  Rare.  EKG today given notice of premature beats on exam.  EKG - SR with no acute ischemic changes.  Doing well.  Follow.        Relevant Orders   EKG 12-Lead (Completed)    Other Visit Diagnoses    Leg cramps       as outlined.  stay hydrated.  gentle stretches.  with recent leg pain, etc, will check ck, esr and electrolytes.  notify me if persistent.     Relevant Orders   Magnesium (Completed)   CK (Creatine Kinase) (Completed)   Sedimentation rate (Completed)   Hordeolum externum of right lower eyelid       warm compresses.  follow.  notify me if persistent.         Einar Pheasant, MD

## 2017-04-25 NOTE — Progress Notes (Signed)
Pre-visit discussion using our clinic review tool. No additional management support is needed unless otherwise documented below in the visit note.  

## 2017-04-26 ENCOUNTER — Encounter: Payer: Self-pay | Admitting: Internal Medicine

## 2017-04-26 DIAGNOSIS — R002 Palpitations: Secondary | ICD-10-CM | POA: Insufficient documentation

## 2017-04-26 NOTE — Assessment & Plan Note (Signed)
Blood pressure under good control.  Continue same medication regimen.  Follow pressures.  Follow metabolic panel.   

## 2017-04-26 NOTE — Assessment & Plan Note (Signed)
Low cholesterol diet and exercise.  On crestor.  Follow lipid panel and liver function tests.  

## 2017-04-26 NOTE — Assessment & Plan Note (Signed)
Never started the metformin.  Has adjusted his diet.  Lost weight.  Sugars better as outlined.  Follow met b and a1c.  Needs to keep up to date with eye exams.  States was just evaluated a few months ago.

## 2017-04-26 NOTE — Assessment & Plan Note (Signed)
Notices occasional palpitations.  No chest pain.  No sob.  Rare.  EKG today given notice of premature beats on exam.  EKG - SR with no acute ischemic changes.  Doing well.  Follow.

## 2017-04-26 NOTE — Assessment & Plan Note (Signed)
Previously treated with coumadin.  Recent leg pain.  ER evaluation as outlined.  Ultrasound negative for DVT.  No pain now.

## 2017-08-13 ENCOUNTER — Telehealth: Payer: Self-pay | Admitting: Internal Medicine

## 2017-08-13 NOTE — Telephone Encounter (Signed)
The pain he was experiencing previously was felt to be more related to mid back pain.  Was referred to physical therapy.  Per message, his pain is now low back pain.  Would recommend reevaluation if persistent pain, to see what is further treatment is needed.  If I have no opening, can see if work in spot.  Thanks.

## 2017-08-13 NOTE — Telephone Encounter (Signed)
Patient states that has had LBP and left leg pain x 1 yr. You sent to PT and has some exercises that help. He has noticed after about 5hrs of wearing his bullet proof vest due it being so restricting that he is having increased pain. States after 12 hr shift his pain is 7/10. Wanted to know if you had any suggestions

## 2017-08-13 NOTE — Telephone Encounter (Signed)
Pt called and stated that he is still having back issues. Pt feels that the bulletproof vest is aggravating the pain more. What can he do? Please advise, thank you!  Call pt @ [phone]

## 2017-08-14 NOTE — Telephone Encounter (Signed)
Left message to return call to our office.  

## 2017-08-21 ENCOUNTER — Telehealth: Payer: Self-pay | Admitting: Internal Medicine

## 2017-08-21 NOTE — Telephone Encounter (Signed)
Spoke with patient I have made f/u for next week. He will bring his vest. So that you can evaluate.

## 2017-08-21 NOTE — Telephone Encounter (Signed)
Pt is returning office phone call. Please call pt on cell phone only.  Thanks

## 2017-08-21 NOTE — Telephone Encounter (Signed)
Left message to return call to our office.  

## 2017-08-30 ENCOUNTER — Ambulatory Visit (INDEPENDENT_AMBULATORY_CARE_PROVIDER_SITE_OTHER): Payer: BC Managed Care – PPO | Admitting: Internal Medicine

## 2017-08-30 ENCOUNTER — Encounter: Payer: Self-pay | Admitting: Internal Medicine

## 2017-08-30 VITALS — BP 114/80 | HR 63 | Temp 97.8°F | Resp 16 | Wt 195.2 lb

## 2017-08-30 DIAGNOSIS — M5442 Lumbago with sciatica, left side: Secondary | ICD-10-CM

## 2017-08-30 DIAGNOSIS — Z0001 Encounter for general adult medical examination with abnormal findings: Secondary | ICD-10-CM | POA: Diagnosis not present

## 2017-08-30 DIAGNOSIS — Z0000 Encounter for general adult medical examination without abnormal findings: Secondary | ICD-10-CM

## 2017-08-30 DIAGNOSIS — E7800 Pure hypercholesterolemia, unspecified: Secondary | ICD-10-CM

## 2017-08-30 DIAGNOSIS — I10 Essential (primary) hypertension: Secondary | ICD-10-CM | POA: Diagnosis not present

## 2017-08-30 DIAGNOSIS — G8929 Other chronic pain: Secondary | ICD-10-CM

## 2017-08-30 DIAGNOSIS — Z125 Encounter for screening for malignant neoplasm of prostate: Secondary | ICD-10-CM

## 2017-08-30 DIAGNOSIS — E119 Type 2 diabetes mellitus without complications: Secondary | ICD-10-CM

## 2017-08-30 NOTE — Progress Notes (Signed)
Patient ID: Jackson Cooper, male   DOB: 07/27/1966, 51 y.o.   MRN: [id number]   Subjective:    Patient ID: Jackson Cooper, male    DOB: Sep 05, 1966, 51 y.o.   MRN: [id number]  HPI  Patient here for his physical exam.  He is accompanied by his wife.  History obtained from both of them.  He has been having some problems with his left lower back/side.  Has to wear a protective vest at all times - with his job.  His movements are restricted in the vest.  He had some back/side issues.  Saw therapy.  They instructed him on stretches and exercise that he does when flares.  He is unable to do this in the vest.  Aggravates his back.  Pain an discomfort does not occur when he is not wearing the vest.  Has worn outer vest previously.  Back did not bother him with a "outer vest".  He is watching what he eats.  Is walking.  No chest pain.  No sob.  No acid reflux.  No abdominal pain.  Bowels moving.     Past Medical History:  Diagnosis Date  . Diabetes mellitus without complication ([place])   . DVT (deep venous thrombosis) ([place]) Diagnosed 9/08  . Encephalitis 1970  . Enlarged prostate   . Hyperglycemia   . Hyperlipidemia   . Hypertension   . Nephrolithiasis    Past Surgical History:  Procedure Laterality Date  . EYE SURGERY  1972   Family History  Problem Relation Age of Onset  . Hypertension Mother   . Breast cancer Sister    Social History   Social History  . Marital status: Married    Spouse name: N/A  . Number of children: N/A  . Years of education: N/A   Social History Main Topics  . Smoking status: Never Smoker  . Smokeless tobacco: Never Used  . Alcohol use No  . Drug use: No  . Sexual activity: Not Asked   Other Topics Concern  . None   Social History Narrative  . None    Outpatient Encounter Prescriptions as of 08/30/2017  Medication Sig  . amLODipine (NORVASC) 5 MG tablet TAKE 1 TABLET BY MOUTH ONCE DAILY  . aspirin 81 MG tablet Take 81 mg by mouth daily. Take 1 tablet by  mouth once a day  . B Complex Vitamins (VITAMIN B-COMPLEX PO) Take 1 tablet by mouth daily.  . cetirizine (ZYRTEC) 10 MG tablet Take 10 mg by mouth daily as needed.   . Flaxseed Oil OIL Take 1,000 mg by mouth daily. 1000 mg   . fluticasone (FLONASE) 50 MCG/ACT nasal spray Place 2 sprays into the nose daily.  . irbesartan (AVAPRO) 150 MG tablet TAKE 1 TABLET BY MOUTH ONCE DAILY  . Krill Oil 350 MG CAPS Take 1 capsule by mouth daily.  [name] LORazepam (ATIVAN) 0.5 MG tablet Take 0.5 mg by mouth every 8 (eight) hours as needed. 1 tablet as directed   . losartan (COZAAR) 25 MG tablet TAKE 1 TABLET BY MOUTH ONCE DAILY  . rosuvastatin (CRESTOR) 20 MG tablet Take 1 tablet (20 mg total) by mouth daily.   No facility-administered encounter medications on file as of 08/30/2017.     Review of Systems  Constitutional: Negative for appetite change and unexpected weight change.  HENT: Negative for congestion and sinus pressure.   Eyes: Negative for pain and visual disturbance.  Respiratory: Negative for cough, chest tightness and shortness of  breath.   Cardiovascular: Negative for chest pain, palpitations and leg swelling.  Gastrointestinal: Negative for abdominal pain, diarrhea, nausea and vomiting.  Genitourinary: Negative for difficulty urinating and dysuria.  Musculoskeletal: Positive for back pain. Negative for joint swelling.  Skin: Negative for color change and rash.  Neurological: Negative for dizziness, light-headedness and headaches.  Hematological: Negative for adenopathy. Does not bruise/bleed easily.  Psychiatric/Behavioral: Negative for agitation and dysphoric mood.       Objective:     Blood pressure rechecked by me:  118/78  Physical Exam  Constitutional: He is oriented to person, place, and time. He appears well-developed and well-nourished. No distress.  HENT:  Head: Normocephalic and atraumatic.  Nose: Nose normal.  Mouth/Throat: Oropharynx is clear and moist. No oropharyngeal  exudate.  Eyes: Conjunctivae are normal. Right eye exhibits no discharge. Left eye exhibits no discharge.  Neck: Neck supple. No thyromegaly present.  Cardiovascular: Normal rate and regular rhythm.   Pulmonary/Chest: Breath sounds normal. No respiratory distress. He has no wheezes.  Abdominal: Soft. Bowel sounds are normal. There is no tenderness.  Genitourinary:  Genitourinary Comments: Rectal exam:  No palpable prostate nodules.  Heme negative.   Musculoskeletal: He exhibits no edema or tenderness.  Lymphadenopathy:    He has no cervical adenopathy.  Neurological: He is alert and oriented to person, place, and time.  Skin: Skin is warm and dry. No rash noted. No erythema.  Psychiatric: He has a normal mood and affect. His behavior is normal.    BP 114/80 (BP Location: Left Arm, Patient Position: Sitting, Cuff Size: Normal)   Pulse 63   Temp 97.8 F (36.6 C) (Oral)   Resp 16   Wt 195 lb 3.2 oz (88.5 kg)   SpO2 98%   BMI 28.83 kg/m  Wt Readings from Last 3 Encounters:  08/30/17 195 lb 3.2 oz (88.5 kg)  04/25/17 193 lb 12.8 oz (87.9 kg)  03/15/17 199 lb (90.3 kg)     Lab Results  Component Value Date   WBC 7.5 03/15/2017   HGB 16.2 03/15/2017   HCT 46.7 03/15/2017   PLT 189 03/15/2017   GLUCOSE 113 (H) 04/25/2017   CHOL 186 03/09/2017   TRIG 116.0 03/09/2017   HDL 35.70 (L) 03/09/2017   LDLCALC 127 (H) 03/09/2017   ALT 22 03/09/2017   AST 15 03/09/2017   NA 138 04/25/2017   K 4.0 04/25/2017   CL 106 04/25/2017   CREATININE 1.00 04/25/2017   BUN 18 04/25/2017   CO2 27 04/25/2017   TSH 0.80 09/22/2016   PSA 0.67 04/12/2016   HGBA1C 8.0 (H) 03/09/2017   MICROALBUR <0.7 03/09/2017    US Venous Img Lower Unilateral Left  Result Date: 03/15/2017 CLINICAL DATA:  51 year old male with left lower extremity pain, recent travel. Personal history of left lower extremity DVT in 2008. EXAM: LEFT LOWER EXTREMITY VENOUS DOPPLER ULTRASOUND TECHNIQUE: Gray-scale sonography  with graded compression, as well as color Doppler and duplex ultrasound were performed to evaluate the lower extremity deep venous systems from the level of the common femoral vein and including the common femoral, femoral, profunda femoral, popliteal and calf veins including the posterior tibial, peroneal and gastrocnemius veins when visible. The superficial great saphenous vein was also interrogated. Spectral Doppler was utilized to evaluate flow at rest and with distal augmentation maneuvers in the common femoral, femoral and popliteal veins. COMPARISON:  04/28/2008 and earlier FINDINGS: Contralateral Common Femoral Vein: Respiratory phasicity is normal and symmetric with the symptomatic side.  No evidence of thrombus. Normal compressibility. Common Femoral Vein: No evidence of thrombus. Normal compressibility, respiratory phasicity and response to augmentation. Saphenofemoral Junction: No evidence of thrombus. Normal compressibility and flow on color Doppler imaging. Profunda Femoral Vein: No evidence of thrombus. Normal compressibility and flow on color Doppler imaging. Femoral Vein: No evidence of thrombus. Normal compressibility, respiratory phasicity and response to augmentation. Popliteal Vein: No evidence of thrombus. Normal compressibility, respiratory phasicity and response to augmentation. Calf Veins: No evidence of thrombus. Normal compressibility and flow on color Doppler imaging. Superficial Great Saphenous Vein: No evidence of thrombus. Normal compressibility and flow on color Doppler imaging. Venous Reflux:  None. Other Findings:  None. IMPRESSION: No evidence of left lower extremity deep venous thrombosis. Electronically Signed   By: Genevie Ann M.D.   On: 03/15/2017 12:13       Assessment & Plan:   Problem List Items Addressed This Visit    Diabetes mellitus without complication ([place])    On no medication.  Has adjusted his diet.  Exercising.  Follow met b and a1c.  Discussed the need to keep  f/u eye exams.        Relevant Orders   Hemoglobin Q2S   Basic metabolic panel   Essential hypertension, benign    Blood pressure under good control.  Continue same medication regimen.  Follow pressures.  Follow metabolic panel.        Relevant Orders   TSH   Healthcare maintenance    Physical today 08/30/17.  Colonoscopy 01/23/17 as outlined.  Recommended f/u in 12/2021.  Check psa with next labs.        Hypercholesterolemia    Low cholesterol diet and exercise.  On crestor.  Follow lipid panel and liver function tests.        Relevant Orders   Lipid panel   Hepatic function panel   Low back pain    Has had previous MRI of lower back.  See previous notes.  Went to therapy.  Improved with exercise and stretches.  Aggravated by his protective vest.  The outer vest does not aggravate.  The vest he is wearing now restricts him and he is unable to do his stretches and position changes while wearing the vest.  Request letter to wear outer vest.  Pain does not occur when he is not wearing the vest.         Other Visit Diagnoses    Routine general medical examination at a health care facility    -  Primary   Prostate cancer screening       Relevant Orders   PSA       Einar Pheasant, MD

## 2017-09-02 ENCOUNTER — Encounter: Payer: Self-pay | Admitting: Internal Medicine

## 2017-09-02 NOTE — Assessment & Plan Note (Signed)
Blood pressure under good control.  Continue same medication regimen.  Follow pressures.  Follow metabolic panel.   

## 2017-09-02 NOTE — Assessment & Plan Note (Signed)
Physical today 08/30/17.  Colonoscopy 01/23/17 as outlined.  Recommended f/u in 12/2021.  Check psa with next labs.

## 2017-09-02 NOTE — Assessment & Plan Note (Signed)
On no medication.  Has adjusted his diet.  Exercising.  Follow met b and a1c.  Discussed the need to keep f/u eye exams.   

## 2017-09-02 NOTE — Assessment & Plan Note (Signed)
Low cholesterol diet and exercise.  On crestor.  Follow lipid panel and liver function tests.  

## 2017-09-02 NOTE — Assessment & Plan Note (Signed)
Has had previous MRI of lower back.  See previous notes.  Went to therapy.  Improved with exercise and stretches.  Aggravated by his protective vest.  The outer vest does not aggravate.  The vest he is wearing now restricts him and he is unable to do his stretches and position changes while wearing the vest.  Request letter to wear outer vest.  Pain does not occur when he is not wearing the vest.

## 2017-09-04 ENCOUNTER — Encounter: Payer: Self-pay | Admitting: Internal Medicine

## 2017-09-13 ENCOUNTER — Other Ambulatory Visit: Payer: BC Managed Care – PPO

## 2017-09-21 ENCOUNTER — Other Ambulatory Visit (INDEPENDENT_AMBULATORY_CARE_PROVIDER_SITE_OTHER): Payer: BC Managed Care – PPO

## 2017-09-21 DIAGNOSIS — I10 Essential (primary) hypertension: Secondary | ICD-10-CM

## 2017-09-21 DIAGNOSIS — E7800 Pure hypercholesterolemia, unspecified: Secondary | ICD-10-CM

## 2017-09-21 DIAGNOSIS — E119 Type 2 diabetes mellitus without complications: Secondary | ICD-10-CM

## 2017-09-21 DIAGNOSIS — Z125 Encounter for screening for malignant neoplasm of prostate: Secondary | ICD-10-CM

## 2017-09-21 LAB — HEPATIC FUNCTION PANEL
ALK PHOS: 58 U/L (ref 39–117)
ALT: 17 U/L (ref 0–53)
AST: 15 U/L (ref 0–37)
Albumin: 4.5 g/dL (ref 3.5–5.2)
BILIRUBIN DIRECT: 0.1 mg/dL (ref 0.0–0.3)
TOTAL PROTEIN: 6.7 g/dL (ref 6.0–8.3)
Total Bilirubin: 0.8 mg/dL (ref 0.2–1.2)

## 2017-09-21 LAB — BASIC METABOLIC PANEL
BUN: 14 mg/dL (ref 6–23)
CALCIUM: 9.4 mg/dL (ref 8.4–10.5)
CO2: 31 meq/L (ref 19–32)
CREATININE: 0.97 mg/dL (ref 0.40–1.50)
Chloride: 103 mEq/L (ref 96–112)
GFR: 86.01 mL/min (ref >60.00)
GLUCOSE: 127 mg/dL — AB (ref 70–99)
Potassium: 4.2 mEq/L (ref 3.5–5.1)
Sodium: 140 mEq/L (ref 135–145)

## 2017-09-21 LAB — LIPID PANEL
CHOLESTEROL: 186 mg/dL (ref 0–200)
HDL: 39.5 mg/dL (ref >39.00)
LDL CALC: 126 mg/dL — AB (ref 0–99)
NonHDL: 146.1
TRIGLYCERIDES: 99 mg/dL (ref 0.0–149.0)
Total CHOL/HDL Ratio: 5
VLDL: 19.8 mg/dL (ref 0.0–40.0)

## 2017-09-21 LAB — TSH: TSH: 1.02 u[IU]/mL (ref 0.35–4.50)

## 2017-09-21 LAB — PSA: PSA: 0.84 ng/mL (ref 0.10–4.00)

## 2017-09-21 LAB — HEMOGLOBIN A1C: Hgb A1c MFr Bld: 6.9 % — ABNORMAL HIGH (ref 4.6–6.5)

## 2017-10-29 ENCOUNTER — Telehealth: Payer: Self-pay | Admitting: Internal Medicine

## 2017-10-29 NOTE — Telephone Encounter (Signed)
Verified with [facility] in [place] that patients medication is not on recall and also made patient and patients wife aware.

## 2017-10-29 NOTE — Telephone Encounter (Signed)
Copied from CRM [id number]. Topic: Inquiry >> Oct 29, 2017  9:02 AM Shary DecampLoye, Jackson Cooper wrote: Jackson Cooper, Patient'Cooper wife called in regards to scripts  amLODipine (NORVASC) 5 MG tablet  and irbesartan (AVAPRO) 150 MG tablet being recalled. Patient would like to discuss how to move forward with what else to take since those scripts are being recalled. Patient would like to speak to a nurse or provider to clarify. Best contact number# [phone]  Reviewed above.  Needs to call pharmacy and see if this recall is affecting him.  May not be same manufacturer, etc.

## 2018-01-01 ENCOUNTER — Ambulatory Visit: Payer: BC Managed Care – PPO | Admitting: Internal Medicine

## 2018-01-30 LAB — HM DIABETES EYE EXAM

## 2018-03-22 ENCOUNTER — Ambulatory Visit: Payer: BC Managed Care – PPO | Admitting: Internal Medicine

## 2018-03-22 DIAGNOSIS — E7800 Pure hypercholesterolemia, unspecified: Secondary | ICD-10-CM | POA: Diagnosis not present

## 2018-03-22 DIAGNOSIS — I10 Essential (primary) hypertension: Secondary | ICD-10-CM

## 2018-03-22 DIAGNOSIS — E119 Type 2 diabetes mellitus without complications: Secondary | ICD-10-CM

## 2018-03-22 NOTE — Progress Notes (Signed)
Patient ID: Jackson Cooper, male   DOB: November 13, 1966, 52 y.o.   MRN: [id number]   Subjective:    Patient ID: Jackson Cooper, male    DOB: Apr 11, 1966, 52 y.o.   MRN: [id number]  HPI  Patient here for a scheduled follow up.  States he is doing well.  Feels good.  Not watching his diet as well, but is watching.  No chest pain.  No sob.  Is trying to stay active.  No acid reflux.  No abdominal pain.  Bowels moving.  Not checking sugars on a regular basis.  When checked - highest 120.  Saw ophthalmology and being treated for sty.  On doxycycline.  Did not start crestor.     Past Medical History:  Diagnosis Date  . Diabetes mellitus without complication ([place])   . DVT (deep venous thrombosis) ([place]) Diagnosed 9/08  . Encephalitis 1970  . Enlarged prostate   . Hyperglycemia   . Hyperlipidemia   . Hypertension   . Nephrolithiasis    Past Surgical History:  Procedure Laterality Date  . EYE SURGERY  1972   Family History  Problem Relation Age of Onset  . Hypertension Mother   . Breast cancer Sister    Social History   Socioeconomic History  . Marital status: Married    Spouse name: Not on file  . Number of children: Not on file  . Years of education: Not on file  . Highest education level: Not on file  Occupational History  . Not on file  Social Needs  . Financial resource strain: Not on file  . Food insecurity:    Worry: Not on file    Inability: Not on file  . Transportation needs:    Medical: Not on file    Non-medical: Not on file  Tobacco Use  . Smoking status: Never Smoker  . Smokeless tobacco: Never Used  Substance and Sexual Activity  . Alcohol use: No    Alcohol/week: 0.0 oz  . Drug use: No  . Sexual activity: Not on file  Lifestyle  . Physical activity:    Days per week: Not on file    Minutes per session: Not on file  . Stress: Not on file  Relationships  . Social connections:    Talks on phone: Not on file    Gets together: Not on file    Attends religious  service: Not on file    Active member of club or organization: Not on file    Attends meetings of clubs or organizations: Not on file    Relationship status: Not on file  Other Topics Concern  . Not on file  Social History Narrative  . Not on file    Outpatient Encounter Medications as of 03/22/2018  Medication Sig  . amLODipine (NORVASC) 5 MG tablet TAKE 1 TABLET BY MOUTH ONCE DAILY  . aspirin 81 MG tablet Take 81 mg by mouth daily. Take 1 tablet by mouth once a day  . B Complex Vitamins (VITAMIN B-COMPLEX PO) Take 1 tablet by mouth daily.  . cetirizine (ZYRTEC) 10 MG tablet Take 10 mg by mouth daily as needed.   . Flaxseed Oil OIL Take 1,000 mg by mouth daily. 1000 mg   . fluticasone (FLONASE) 50 MCG/ACT nasal spray Place 2 sprays into the nose daily.  . irbesartan (AVAPRO) 150 MG tablet TAKE 1 TABLET BY MOUTH ONCE DAILY  . Krill Oil 350 MG CAPS Take 1 capsule by mouth daily.  [name]  LORazepam (ATIVAN) 0.5 MG tablet Take 0.5 mg by mouth every 8 (eight) hours as needed. 1 tablet as directed   . [DISCONTINUED] losartan (COZAAR) 25 MG tablet TAKE 1 TABLET BY MOUTH ONCE DAILY  . [DISCONTINUED] rosuvastatin (CRESTOR) 20 MG tablet Take 1 tablet (20 mg total) by mouth daily.   No facility-administered encounter medications on file as of 03/22/2018.     Review of Systems  Constitutional: Negative for appetite change and unexpected weight change.  HENT: Negative for congestion and sinus pressure.   Respiratory: Negative for cough, chest tightness and shortness of breath.   Cardiovascular: Negative for chest pain, palpitations and leg swelling.  Gastrointestinal: Negative for abdominal pain, diarrhea, nausea and vomiting.  Genitourinary: Negative for difficulty urinating and dysuria.  Musculoskeletal: Negative for joint swelling and myalgias.  Skin: Negative for color change and rash.  Neurological: Negative for dizziness, light-headedness and headaches.  Psychiatric/Behavioral: Negative for  agitation and dysphoric mood.       Objective:    Physical Exam  Constitutional: He appears well-developed and well-nourished. No distress.  HENT:  Nose: Nose normal.  Mouth/Throat: Oropharynx is clear and moist.  Neck: Neck supple. No thyromegaly present.  Cardiovascular: Normal rate and regular rhythm.  Pulmonary/Chest: Effort normal and breath sounds normal. No respiratory distress.  Abdominal: Soft. Bowel sounds are normal. There is no tenderness.  Musculoskeletal: He exhibits no edema or tenderness.  Lymphadenopathy:    He has no cervical adenopathy.  Skin: No rash noted. No erythema.  Psychiatric: He has a normal mood and affect. His behavior is normal.    BP 126/70 (BP Location: Left Arm, Patient Position: Sitting, Cuff Size: Normal)   Pulse 68   Temp 97.8 F (36.6 C) (Oral)   Resp 18   Wt 196 lb 6.4 oz (89.1 kg)   SpO2 98%   BMI 29.00 kg/m  Wt Readings from Last 3 Encounters:  03/22/18 196 lb 6.4 oz (89.1 kg)  08/30/17 195 lb 3.2 oz (88.5 kg)  04/25/17 193 lb 12.8 oz (87.9 kg)     Lab Results  Component Value Date   WBC 7.5 03/15/2017   HGB 16.2 03/15/2017   HCT 46.7 03/15/2017   PLT 189 03/15/2017   GLUCOSE 127 (H) 09/21/2017   CHOL 186 09/21/2017   TRIG 99.0 09/21/2017   HDL 39.50 09/21/2017   LDLCALC 126 (H) 09/21/2017   ALT 17 09/21/2017   AST 15 09/21/2017   NA 140 09/21/2017   K 4.2 09/21/2017   CL 103 09/21/2017   CREATININE 0.97 09/21/2017   BUN 14 09/21/2017   CO2 31 09/21/2017   TSH 1.02 09/21/2017   PSA 0.84 09/21/2017   HGBA1C 6.9 (H) 09/21/2017   MICROALBUR <0.7 03/09/2017    US Venous Img Lower Unilateral Left  Result Date: 03/15/2017 CLINICAL DATA:  52 year old male with left lower extremity pain, recent travel. Personal history of left lower extremity DVT in 2008. EXAM: LEFT LOWER EXTREMITY VENOUS DOPPLER ULTRASOUND TECHNIQUE: Gray-scale sonography with graded compression, as well as color Doppler and duplex ultrasound were  performed to evaluate the lower extremity deep venous systems from the level of the common femoral vein and including the common femoral, femoral, profunda femoral, popliteal and calf veins including the posterior tibial, peroneal and gastrocnemius veins when visible. The superficial great saphenous vein was also interrogated. Spectral Doppler was utilized to evaluate flow at rest and with distal augmentation maneuvers in the common femoral, femoral and popliteal veins. COMPARISON:  04/28/2008 and earlier  FINDINGS: Contralateral Common Femoral Vein: Respiratory phasicity is normal and symmetric with the symptomatic side. No evidence of thrombus. Normal compressibility. Common Femoral Vein: No evidence of thrombus. Normal compressibility, respiratory phasicity and response to augmentation. Saphenofemoral Junction: No evidence of thrombus. Normal compressibility and flow on color Doppler imaging. Profunda Femoral Vein: No evidence of thrombus. Normal compressibility and flow on color Doppler imaging. Femoral Vein: No evidence of thrombus. Normal compressibility, respiratory phasicity and response to augmentation. Popliteal Vein: No evidence of thrombus. Normal compressibility, respiratory phasicity and response to augmentation. Calf Veins: No evidence of thrombus. Normal compressibility and flow on color Doppler imaging. Superficial Great Saphenous Vein: No evidence of thrombus. Normal compressibility and flow on color Doppler imaging. Venous Reflux:  None. Other Findings:  None. IMPRESSION: No evidence of left lower extremity deep venous thrombosis. Electronically Signed   By: Genevie Ann M.D.   On: 03/15/2017 12:13       Assessment & Plan:   Problem List Items Addressed This Visit    Diabetes mellitus without complication ([place])    Has adjusted diet.  Discussed diet and exercise.  Follow met b and a1c.  Sugars appear to be doing better.        Relevant Orders   Hemoglobin A1c   Microalbumin / creatinine  urine ratio   Essential hypertension, benign    Blood pressure under good control.  Continue same medication regimen.  Follow pressures.  Follow metabolic panel.        Relevant Orders   CBC with Differential/Platelet   Basic metabolic panel   Hypercholesterolemia    Have discussed calculated cholesterol risk.  Had given him a rx for crestor.  He decided not to take.  Follow lipid panel.        Relevant Orders   Hepatic function panel   Lipid panel       Einar Pheasant, MD

## 2018-03-24 ENCOUNTER — Encounter: Payer: Self-pay | Admitting: Internal Medicine

## 2018-03-24 NOTE — Assessment & Plan Note (Signed)
Has adjusted diet.  Discussed diet and exercise.  Follow met b and a1c.  Sugars appear to be doing better.

## 2018-03-24 NOTE — Assessment & Plan Note (Signed)
Blood pressure under good control.  Continue same medication regimen.  Follow pressures.  Follow metabolic panel.   

## 2018-03-24 NOTE — Assessment & Plan Note (Signed)
Have discussed calculated cholesterol risk.  Had given him a rx for crestor.  He decided not to take.  Follow lipid panel.

## 2018-03-25 ENCOUNTER — Other Ambulatory Visit: Payer: Self-pay | Admitting: Internal Medicine

## 2018-04-10 ENCOUNTER — Other Ambulatory Visit (INDEPENDENT_AMBULATORY_CARE_PROVIDER_SITE_OTHER): Payer: BC Managed Care – PPO

## 2018-04-10 DIAGNOSIS — E7800 Pure hypercholesterolemia, unspecified: Secondary | ICD-10-CM

## 2018-04-10 DIAGNOSIS — I10 Essential (primary) hypertension: Secondary | ICD-10-CM | POA: Diagnosis not present

## 2018-04-10 DIAGNOSIS — E119 Type 2 diabetes mellitus without complications: Secondary | ICD-10-CM

## 2018-04-10 LAB — CBC WITH DIFFERENTIAL/PLATELET
BASOS PCT: 0.5 % (ref 0.0–3.0)
Basophils Absolute: 0 10*3/uL (ref 0.0–0.1)
EOS ABS: 0.1 10*3/uL (ref 0.0–0.7)
Eosinophils Relative: 1.8 % (ref 0.0–5.0)
HEMATOCRIT: 43.7 % (ref 39.0–52.0)
Hemoglobin: 15.2 g/dL (ref 13.0–17.0)
LYMPHS ABS: 2.1 10*3/uL (ref 0.7–4.0)
Lymphocytes Relative: 29.6 % (ref 12.0–46.0)
MCHC: 34.7 g/dL (ref 30.0–36.0)
MCV: 89.4 fl (ref 78.0–100.0)
MONOS PCT: 7.3 % (ref 3.0–12.0)
Monocytes Absolute: 0.5 10*3/uL (ref 0.1–1.0)
Neutro Abs: 4.2 10*3/uL (ref 1.4–7.7)
Neutrophils Relative %: 60.8 % (ref 43.0–77.0)
PLATELETS: 194 10*3/uL (ref 150.0–400.0)
RBC: 4.89 Mil/uL (ref 4.22–5.81)
RDW: 13 % (ref 11.5–15.5)
WBC: 6.9 10*3/uL (ref 4.0–10.5)

## 2018-04-10 LAB — BASIC METABOLIC PANEL
BUN: 14 mg/dL (ref 6–23)
CHLORIDE: 102 meq/L (ref 96–112)
CO2: 27 mEq/L (ref 19–32)
Calcium: 9.5 mg/dL (ref 8.4–10.5)
Creatinine, Ser: 1.08 mg/dL (ref 0.40–1.50)
GFR: 75.82 mL/min (ref >60.00)
Glucose, Bld: 145 mg/dL — ABNORMAL HIGH (ref 70–99)
Potassium: 4.3 mEq/L (ref 3.5–5.1)
SODIUM: 139 meq/L (ref 135–145)

## 2018-04-10 LAB — HEPATIC FUNCTION PANEL
ALK PHOS: 54 U/L (ref 39–117)
ALT: 19 U/L (ref 0–53)
AST: 16 U/L (ref 0–37)
Albumin: 4.2 g/dL (ref 3.5–5.2)
Bilirubin, Direct: 0.1 mg/dL (ref 0.0–0.3)
Total Bilirubin: 0.8 mg/dL (ref 0.2–1.2)
Total Protein: 6.6 g/dL (ref 6.0–8.3)

## 2018-04-10 LAB — LIPID PANEL
CHOL/HDL RATIO: 5
Cholesterol: 170 mg/dL (ref 0–200)
HDL: 34.1 mg/dL — AB (ref >39.00)
LDL Cholesterol: 102 mg/dL — ABNORMAL HIGH (ref 0–99)
NONHDL: 135.74
Triglycerides: 169 mg/dL — ABNORMAL HIGH (ref 0.0–149.0)
VLDL: 33.8 mg/dL (ref 0.0–40.0)

## 2018-04-10 LAB — MICROALBUMIN / CREATININE URINE RATIO
CREATININE, U: 81 mg/dL
MICROALB/CREAT RATIO: 0.9 mg/g (ref 0.0–30.0)

## 2018-04-10 LAB — HEMOGLOBIN A1C: Hgb A1c MFr Bld: 7.2 % — ABNORMAL HIGH (ref 4.6–6.5)

## 2018-04-30 ENCOUNTER — Other Ambulatory Visit: Payer: Self-pay | Admitting: Internal Medicine

## 2018-09-20 ENCOUNTER — Ambulatory Visit: Payer: BC Managed Care – PPO | Admitting: Internal Medicine

## 2018-11-29 IMAGING — CT CT ABD-PELV W/ CM
1 of 3 series · 14 of 32 positions shown, 19 images · IV contrast (APPLIED)
Comparison: CT abdomen 04/28/2008

CLINICAL DATA: Worsening left upper quadrant pain since 7742. Pain
intensifies while driving or setting long periods of time. Hx DVT
6778 s/p tonsillectomy.

EXAM:
CT ABDOMEN AND PELVIS WITH CONTRAST
TECHNIQUE: Multidetector CT imaging of the abdomen and pelvis was performed
using the standard protocol following bolus administration of
intravenous contrast.
CONTRAST:  100mL [id number] IOPAMIDOL ([id number]) INJECTION 61%

[Series 2: axial st · axial · 0.75mm/px · z∈[-980,-550]mm · 14 of 98 slices shown, 19 images]
[im 6/98  soft-tissue]
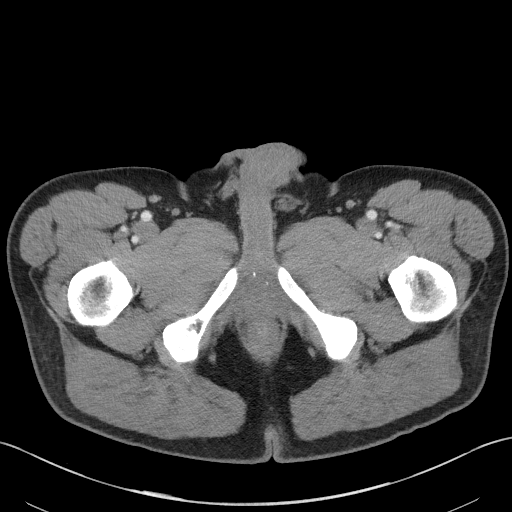
[im 6/98  bone]
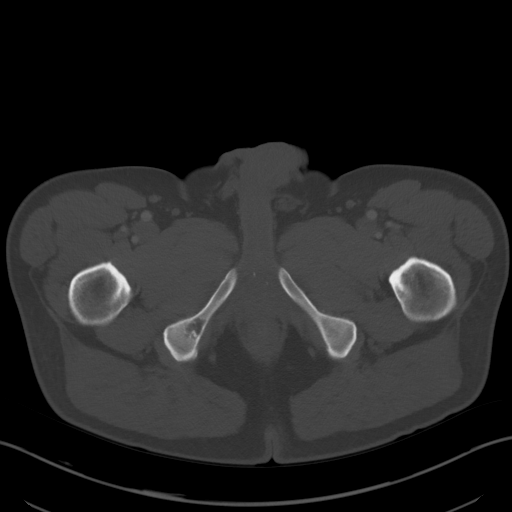
[im 11/98  soft-tissue]
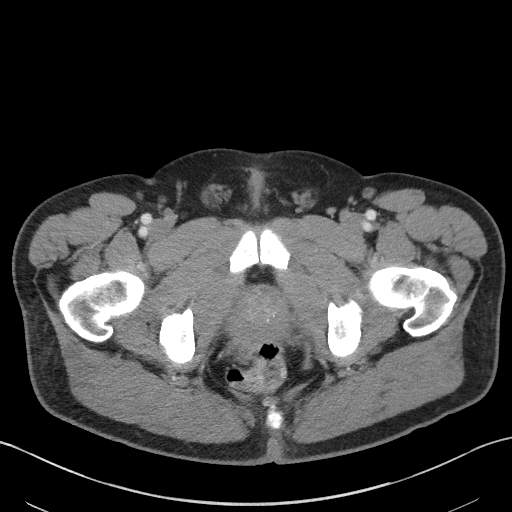
[im 22/98  soft-tissue]
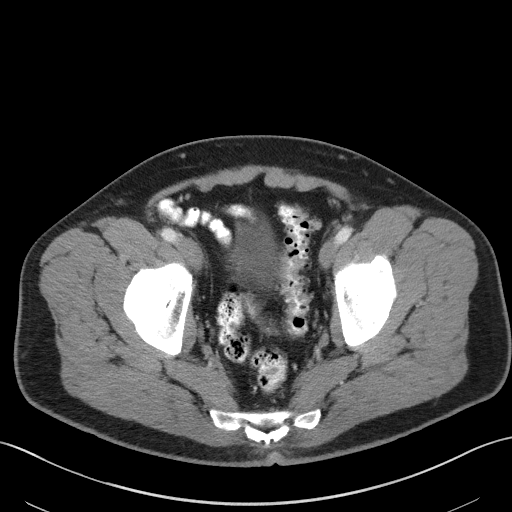
[im 27/98  soft-tissue]
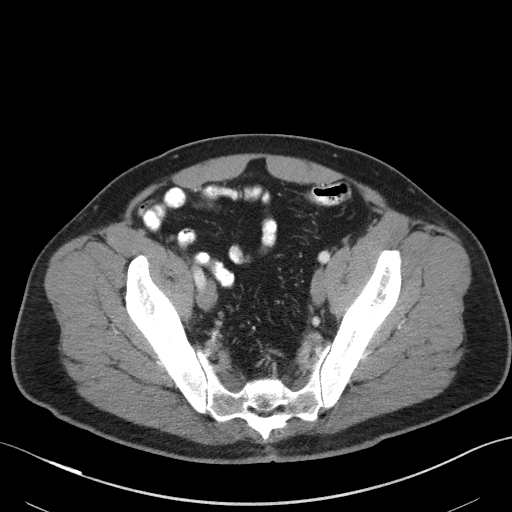
[im 33/98  soft-tissue]
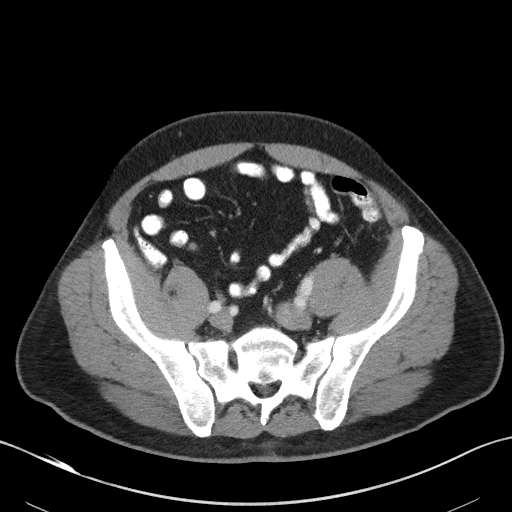
[im 44/98  soft-tissue]
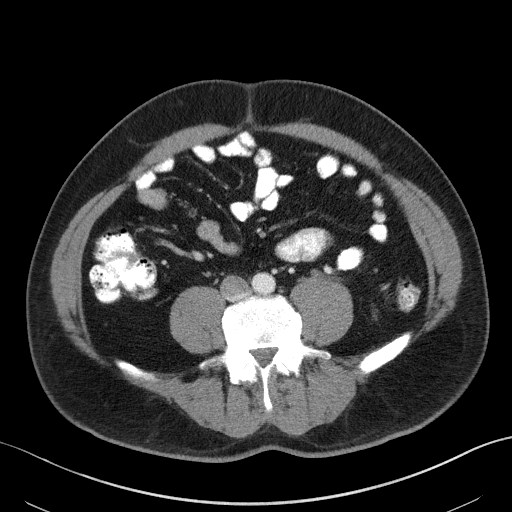
[im 49/98  soft-tissue]
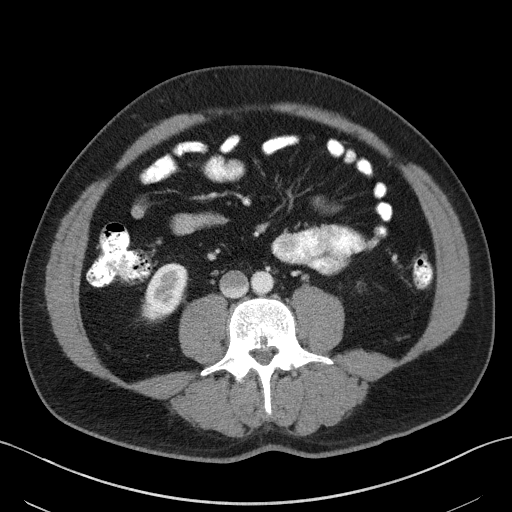
[im 54/98  soft-tissue]
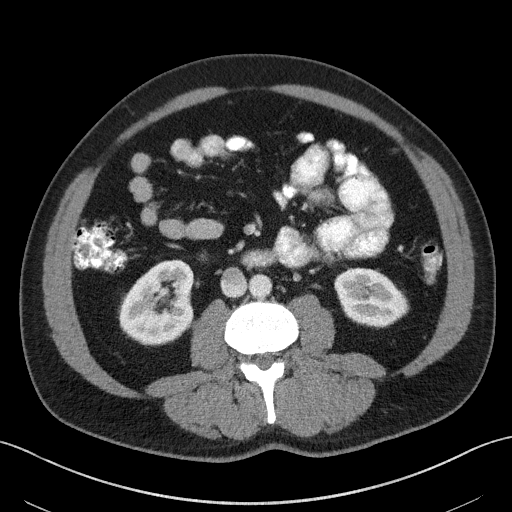
[im 65/98  soft-tissue]
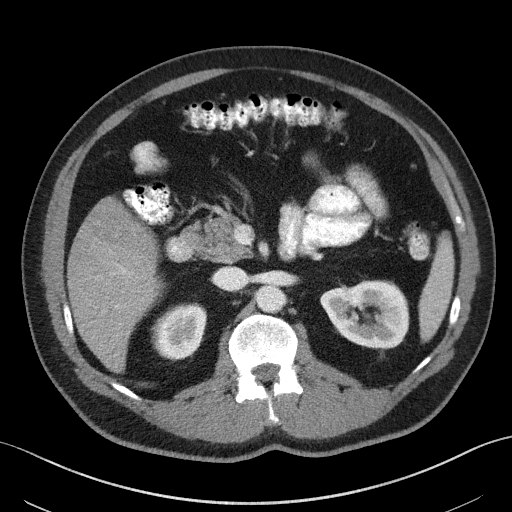
[im 65/98  bone]
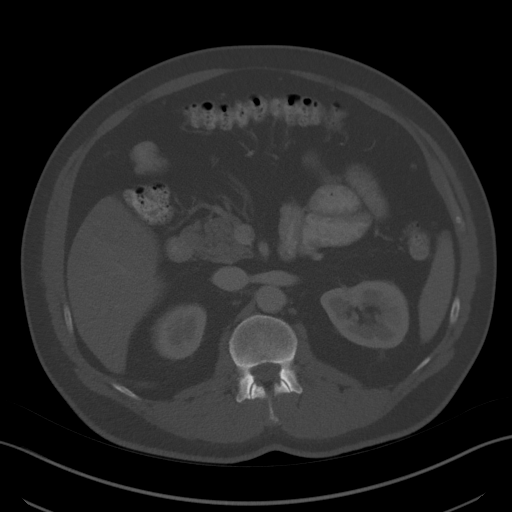
[im 71/98  soft-tissue]
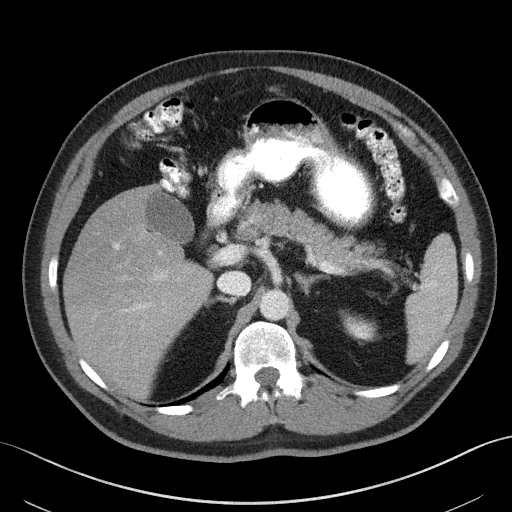
[im 76/98  soft-tissue]
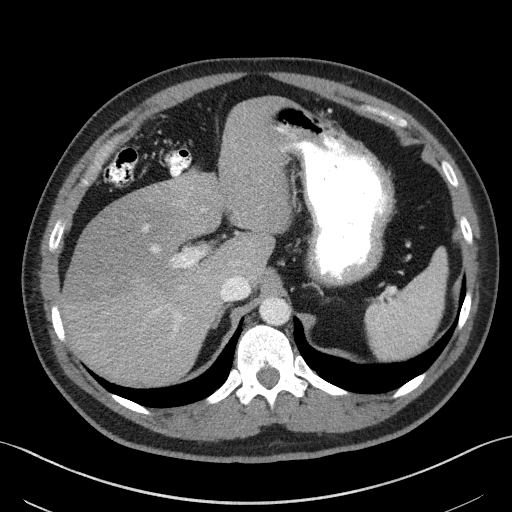
[im 76/98  lung]
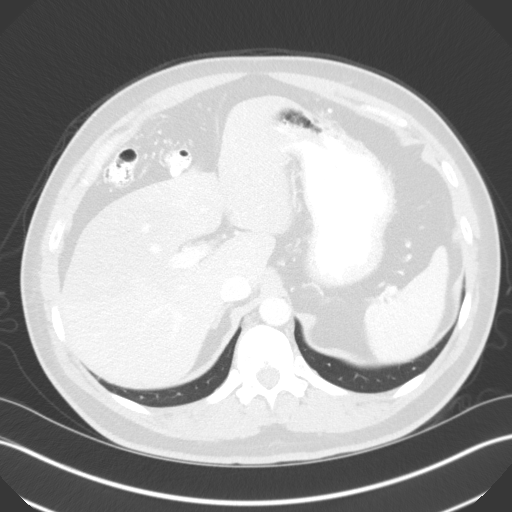
[im 81/98  lung]
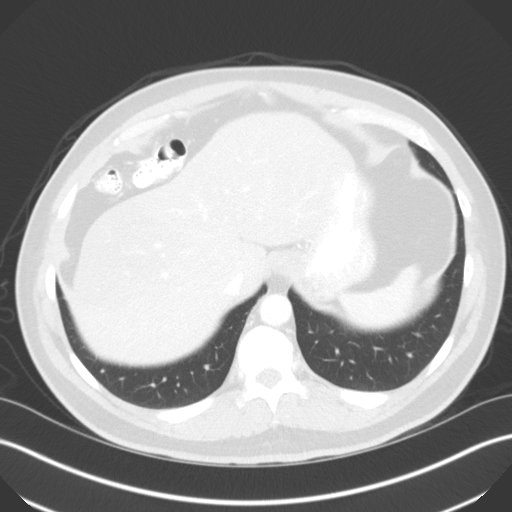
[im 87/98  soft-tissue]
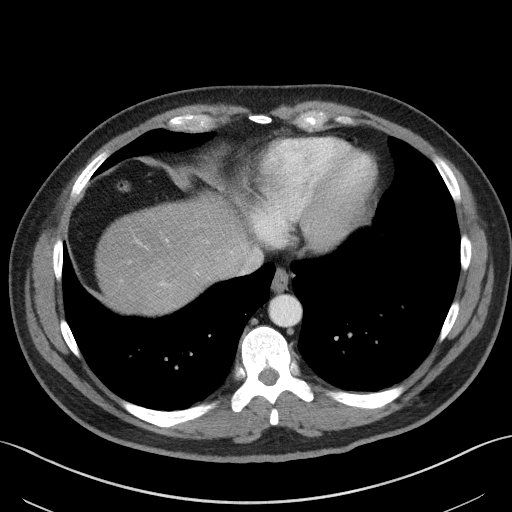
[im 87/98  lung]
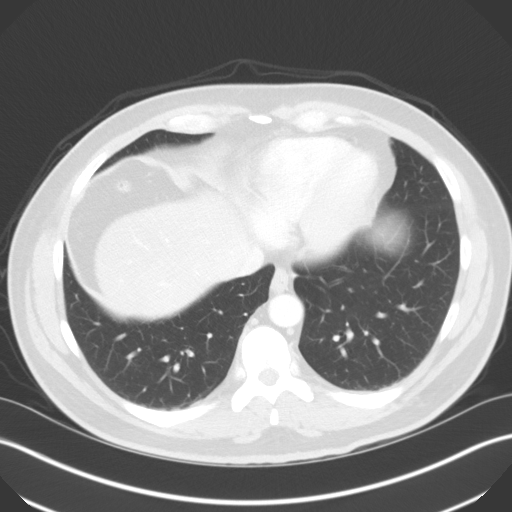
[im 92/98  soft-tissue]
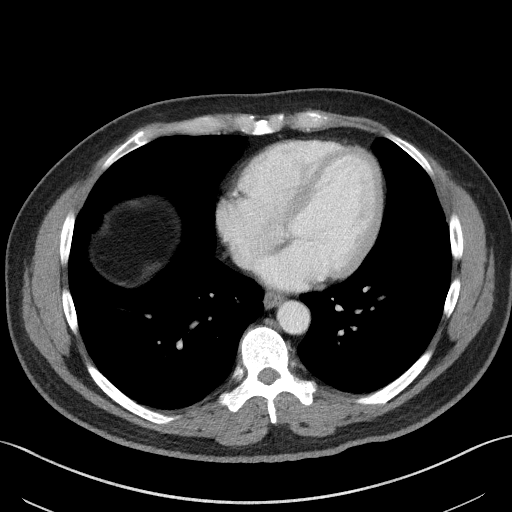
[im 92/98  lung]
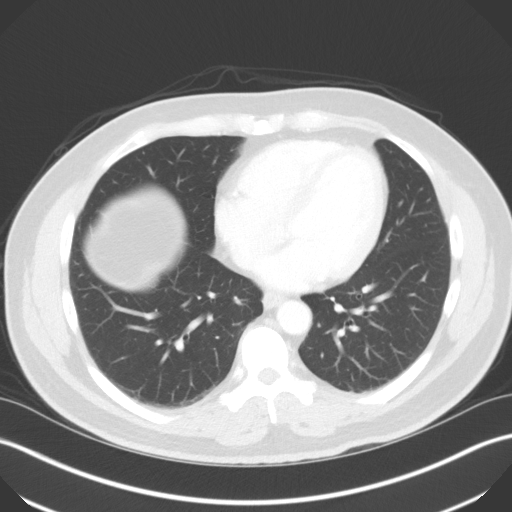

[14 of 32 positions shown; findings below may reference images not displayed]

FINDINGS: Lower chest: No acute abnormality.

Hepatobiliary: No focal liver abnormality is seen. Diffuse low
attenuation of the liver as can be seen with hepatic steatosis. No
gallstones, gallbladder wall thickening, or biliary dilatation.

Pancreas: Unremarkable. No pancreatic ductal dilatation or
surrounding inflammatory changes.

Spleen: Normal in size without focal abnormality.

Adrenals/Urinary Tract: Adrenal glands are unremarkable. Kidneys are
normal, without renal calculi, focal lesion, or hydronephrosis.
Bladder is unremarkable.

Stomach/Bowel: Stomach is within normal limits. Diverticulosis of
the sigmoid colon without evidence diverticulitis. Appendix appears
normal. No evidence of bowel wall thickening, distention, or
inflammatory changes.

Vascular/Lymphatic: No significant vascular findings are present. No
enlarged abdominal or pelvic lymph nodes.

Reproductive: Prostate is unremarkable.

Other: No abdominal wall hernia or abnormality. No abdominopelvic
ascites.

Musculoskeletal: No acute or significant osseous findings. No lytic
or sclerotic osseous lesion. Mild bilateral facet arthropathy at
L5-S1.
IMPRESSION: 1. No acute abdominal or pelvic pathology.
2. Diverticulosis without evidence of diverticulitis.
3. Normal appendix.
4. Hepatic steatosis.

## 2019-05-28 ENCOUNTER — Other Ambulatory Visit: Payer: Self-pay | Admitting: Internal Medicine

## 2019-11-24 ENCOUNTER — Other Ambulatory Visit: Payer: Self-pay | Admitting: Internal Medicine

## 2019-11-24 DIAGNOSIS — I10 Essential (primary) hypertension: Secondary | ICD-10-CM

## 2019-11-24 DIAGNOSIS — E119 Type 2 diabetes mellitus without complications: Secondary | ICD-10-CM

## 2019-11-24 DIAGNOSIS — E7800 Pure hypercholesterolemia, unspecified: Secondary | ICD-10-CM

## 2019-11-24 NOTE — Telephone Encounter (Signed)
Okay to refill? Last seen on 03/22/18. No future appt scheduled

## 2019-11-24 NOTE — Telephone Encounter (Signed)
I reviewed refill history.  It appears that he was given avapro #30 with 5 refills on 06/07/19.  I have not seen him in over one year and no labs in over one year.  Needs a lab appt and needs a f/u appt with me.  Confirm has enough medication to get through until his appt with me.  Thanks

## 2019-12-04 ENCOUNTER — Other Ambulatory Visit: Payer: Self-pay | Admitting: Internal Medicine

## 2020-01-02 ENCOUNTER — Other Ambulatory Visit: Payer: Self-pay | Admitting: Internal Medicine

## 2020-01-07 ENCOUNTER — Telehealth: Payer: Self-pay | Admitting: Internal Medicine

## 2020-01-07 NOTE — Telephone Encounter (Signed)
Pt needs refill on amLODipine (NORVASC) 5 MG tablet and  irbesartan (AVAPRO) 150 MG tablet. Pt only has a few left. Pt has an appt for a physical on 02/03/20.

## 2020-01-08 NOTE — Telephone Encounter (Signed)
No labs since 2019. Are you ok to fill for 30 days until cpe? Per chart, it does not appear that the amlodipine has been filled in over a year

## 2020-01-08 NOTE — Telephone Encounter (Signed)
Needs fasting lab within the week.  Schedule lab and may refill until appt.

## 2020-01-08 NOTE — Telephone Encounter (Signed)
LMTCB

## 2020-01-09 ENCOUNTER — Telehealth: Payer: Self-pay | Admitting: Internal Medicine

## 2020-01-09 DIAGNOSIS — Z125 Encounter for screening for malignant neoplasm of prostate: Secondary | ICD-10-CM

## 2020-01-09 NOTE — Telephone Encounter (Signed)
Patient is coming for labs on 01/29/2020 and would like to make sure on the labs that Dr. Lorin Picket has ordered to check PSA.

## 2020-01-10 NOTE — Telephone Encounter (Signed)
Psa added to labs.

## 2020-01-13 ENCOUNTER — Other Ambulatory Visit: Payer: Self-pay

## 2020-01-13 MED ORDER — AMLODIPINE BESYLATE 5 MG PO TABS: 5.0000 mg | ORAL_TABLET | Freq: Every day | ORAL | 0 refills | Status: DC

## 2020-01-13 NOTE — Telephone Encounter (Signed)
Patient is out of BP medication. Please call [facility] pharmacy closes at 5pm  Needs ok to fill. Please call Megan at [phone].

## 2020-01-13 NOTE — Telephone Encounter (Signed)
Called pt, 30 day of amlodipine sent in. He will pick up this PM

## 2020-01-13 NOTE — Telephone Encounter (Signed)
Tried to move up patients labs but he is unable to move his labs up. March 3rd is the earliest date he can come in and he is scheduled for 3/4. Are you okay with sending in his medication until appointment. He says he is not out but almost out.

## 2020-01-13 NOTE — Telephone Encounter (Signed)
Ok to send in until appt, but needs to keep appt.

## 2020-01-29 ENCOUNTER — Telehealth: Payer: Self-pay | Admitting: *Deleted

## 2020-01-29 ENCOUNTER — Other Ambulatory Visit (INDEPENDENT_AMBULATORY_CARE_PROVIDER_SITE_OTHER): Payer: BC Managed Care – PPO

## 2020-01-29 ENCOUNTER — Other Ambulatory Visit: Payer: Self-pay

## 2020-01-29 DIAGNOSIS — I10 Essential (primary) hypertension: Secondary | ICD-10-CM

## 2020-01-29 DIAGNOSIS — Z125 Encounter for screening for malignant neoplasm of prostate: Secondary | ICD-10-CM

## 2020-01-29 DIAGNOSIS — E7800 Pure hypercholesterolemia, unspecified: Secondary | ICD-10-CM | POA: Diagnosis not present

## 2020-01-29 DIAGNOSIS — E119 Type 2 diabetes mellitus without complications: Secondary | ICD-10-CM

## 2020-01-29 LAB — LIPID PANEL
Cholesterol: 183 mg/dL (ref 0–200)
HDL: 35.3 mg/dL — ABNORMAL LOW (ref >39.00)
LDL Cholesterol: 128 mg/dL — ABNORMAL HIGH (ref 0–99)
NonHDL: 147.99
Total CHOL/HDL Ratio: 5
Triglycerides: 100 mg/dL (ref 0.0–149.0)
VLDL: 20 mg/dL (ref 0.0–40.0)

## 2020-01-29 LAB — HEMOGLOBIN A1C: Hgb A1c MFr Bld: 7.4 % — ABNORMAL HIGH (ref 4.6–6.5)

## 2020-01-29 LAB — TSH: TSH: 1.52 u[IU]/mL (ref 0.35–4.50)

## 2020-01-29 LAB — CBC WITH DIFFERENTIAL/PLATELET
Basophils Absolute: 0 10*3/uL (ref 0.0–0.1)
Basophils Relative: 0.5 % (ref 0.0–3.0)
Eosinophils Absolute: 0.1 10*3/uL (ref 0.0–0.7)
Eosinophils Relative: 2.4 % (ref 0.0–5.0)
HCT: 45.9 % (ref 39.0–52.0)
Hemoglobin: 15.5 g/dL (ref 13.0–17.0)
Lymphocytes Relative: 31.9 % (ref 12.0–46.0)
Lymphs Abs: 1.9 10*3/uL (ref 0.7–4.0)
MCHC: 33.8 g/dL (ref 30.0–36.0)
MCV: 90.4 fl (ref 78.0–100.0)
Monocytes Absolute: 0.4 10*3/uL (ref 0.1–1.0)
Monocytes Relative: 7.4 % (ref 3.0–12.0)
Neutro Abs: 3.4 10*3/uL (ref 1.4–7.7)
Neutrophils Relative %: 57.8 % (ref 43.0–77.0)
Platelets: 187 10*3/uL (ref 150.0–400.0)
RBC: 5.07 Mil/uL (ref 4.22–5.81)
RDW: 13.1 % (ref 11.5–15.5)
WBC: 6 10*3/uL (ref 4.0–10.5)

## 2020-01-29 LAB — BASIC METABOLIC PANEL
BUN: 12 mg/dL (ref 6–23)
CO2: 28 mEq/L (ref 19–32)
Calcium: 9.4 mg/dL (ref 8.4–10.5)
Chloride: 103 mEq/L (ref 96–112)
Creatinine, Ser: 0.99 mg/dL (ref 0.40–1.50)
GFR: 78.35 mL/min (ref >60.00)
Glucose, Bld: 146 mg/dL — ABNORMAL HIGH (ref 70–99)
Potassium: 4.2 mEq/L (ref 3.5–5.1)
Sodium: 138 mEq/L (ref 135–145)

## 2020-01-29 LAB — HEPATIC FUNCTION PANEL
ALT: 18 U/L (ref 0–53)
AST: 14 U/L (ref 0–37)
Albumin: 4.3 g/dL (ref 3.5–5.2)
Alkaline Phosphatase: 59 U/L (ref 39–117)
Bilirubin, Direct: 0.1 mg/dL (ref 0.0–0.3)
Total Bilirubin: 0.8 mg/dL (ref 0.2–1.2)
Total Protein: 6.7 g/dL (ref 6.0–8.3)

## 2020-01-29 LAB — MICROALBUMIN / CREATININE URINE RATIO
Creatinine,U: 39.9 mg/dL
Microalb Creat Ratio: 1.8 mg/g (ref 0.0–30.0)
Microalb, Ur: <0.7 mg/dL (ref 0.0–1.9)

## 2020-01-29 LAB — PSA: PSA: 1.33 ng/mL (ref 0.10–4.00)

## 2020-01-29 NOTE — Telephone Encounter (Signed)
Pt asked if he needed a urine, I told him nothing was ordered. He wanted to leave one just in case. Last microalbumin was 03/2018. Please place future order if you would like to add anything.

## 2020-01-29 NOTE — Telephone Encounter (Signed)
Order placed for urine microalb/cr ration.  Thank you

## 2020-01-29 NOTE — Addendum Note (Signed)
Addended by: Warden Fillers on: 01/29/2020 11:30 AM   Modules accepted: Orders

## 2020-02-03 ENCOUNTER — Other Ambulatory Visit: Payer: Self-pay

## 2020-02-03 ENCOUNTER — Ambulatory Visit (INDEPENDENT_AMBULATORY_CARE_PROVIDER_SITE_OTHER): Payer: BC Managed Care – PPO | Admitting: Internal Medicine

## 2020-02-03 VITALS — BP 122/70 | HR 70 | Temp 97.3°F | Resp 16 | Ht 69.0 in | Wt 195.6 lb

## 2020-02-03 DIAGNOSIS — I10 Essential (primary) hypertension: Secondary | ICD-10-CM

## 2020-02-03 DIAGNOSIS — E7800 Pure hypercholesterolemia, unspecified: Secondary | ICD-10-CM

## 2020-02-03 DIAGNOSIS — E119 Type 2 diabetes mellitus without complications: Secondary | ICD-10-CM

## 2020-02-03 DIAGNOSIS — Z0000 Encounter for general adult medical examination without abnormal findings: Secondary | ICD-10-CM | POA: Diagnosis not present

## 2020-02-03 MED ORDER — METFORMIN HCL ER 500 MG PO TB24: 500.0000 mg | ORAL_TABLET | Freq: Two times a day (BID) | ORAL | 2 refills | Status: DC

## 2020-02-03 MED ORDER — ROSUVASTATIN CALCIUM 5 MG PO TABS: ORAL_TABLET | ORAL | 1 refills | Status: DC

## 2020-02-03 NOTE — Progress Notes (Signed)
Patient ID: Jackson Cooper, male   DOB: 1965/12/10, 54 y.o.   MRN: [id number]   Subjective:    Patient ID: Jackson Cooper, male    DOB: 1966/04/19, 54 y.o.   MRN: [id number]  HPI This visit occurred during the SARS-CoV-2 public health emergency.  Safety protocols were in place, including screening questions prior to the visit, additional usage of staff PPE, and extensive cleaning of exam room while observing appropriate contact time as indicated for disinfecting solutions.  Patient here for his physical exam.  Has not been seen since 02/2018.  States he has been doing relatively well.  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  Handling stress at work.  Tries to stay active.  No chest pain or sob reported.  Blood pressures 120/70s.  No acid reflux or abdominal pain reported  Recent a1c 7.4.  Discussed treatment options.  Discussed metformin XR.  Also discussed recommendation to start cholesterol medication.  He agrees.  Had eyes checked - ok.  Plans to start walking more.  Discussed diet and exercise.    Past Medical History:  Diagnosis Date  . Diabetes mellitus without complication (HCC)   . DVT (deep venous thrombosis) (HCC) Diagnosed 9/08  . Encephalitis 1970  . Enlarged prostate   . Hyperglycemia   . Hyperlipidemia   . Hypertension   . Nephrolithiasis    Past Surgical History:  Procedure Laterality Date  . EYE SURGERY  1972   Family History  Problem Relation Age of Onset  . Hypertension Mother   . Breast cancer Sister    Social History   Socioeconomic History  . Marital status: Married    Spouse name: Not on file  . Number of children: Not on file  . Years of education: Not on file  . Highest education level: Not on file  Occupational History  . Not on file  Tobacco Use  . Smoking status: Never Smoker  . Smokeless tobacco: Never Used  Substance and Sexual Activity  . Alcohol use: No    Alcohol/week: 0.0 standard drinks  . Drug use: No  . Sexual activity: Not on file  Other Topics  Concern  . Not on file  Social History Narrative  . Not on file   Social Determinants of Health   Financial Resource Strain:   . Difficulty of Paying Living Expenses:   Food Insecurity:   . Worried About Programme researcher, broadcasting/film/video in the Last Year:   . Barista in the Last Year:   Transportation Needs:   . Freight forwarder (Medical):   [name] Lack of Transportation (Non-Medical):   Physical Activity:   . Days of Exercise per Week:   . Minutes of Exercise per Session:   Stress:   . Feeling of Stress :   Social Connections:   . Frequency of Communication with Friends and Family:   . Frequency of Social Gatherings with Friends and Family:   . Attends Religious Services:   . Active Member of Clubs or Organizations:   . Attends Banker Meetings:   [name] Marital Status:     Outpatient Encounter Medications as of 02/03/2020  Medication Sig  . aspirin 81 MG tablet Take 81 mg by mouth daily. Take 1 tablet by mouth once a day  . B Complex Vitamins (VITAMIN B-COMPLEX PO) Take 1 tablet by mouth daily.  . cetirizine (ZYRTEC) 10 MG tablet Take 10 mg by mouth daily as needed.   . Flaxseed Oil OIL  Take 1,000 mg by mouth daily. 1000 mg   . fluticasone (FLONASE) 50 MCG/ACT nasal spray Place 2 sprays into the nose daily.  . irbesartan (AVAPRO) 150 MG tablet Take 1 tablet (150 mg total) by mouth daily.  Javier Docker Oil 350 MG CAPS Take 1 capsule by mouth daily.  [name] LORazepam (ATIVAN) 0.5 MG tablet Take 0.5 mg by mouth every 8 (eight) hours as needed. 1 tablet as directed   . metFORMIN (GLUCOPHAGE XR) 500 MG 24 hr tablet Take 1 tablet (500 mg total) by mouth 2 (two) times daily with a meal.  . rosuvastatin (CRESTOR) 5 MG tablet Take one tablet q Monday, Wednesday and Friday  . [DISCONTINUED] amLODipine (NORVASC) 5 MG tablet Take 1 tablet (5 mg total) by mouth daily.  . [DISCONTINUED] irbesartan (AVAPRO) 300 MG tablet TAKE 1/2 TABLET (150MG ) BY MOUTH ONCE DAILY   No facility-administered  encounter medications on file as of 02/03/2020.   Review of Systems  Constitutional: Negative for appetite change and unexpected weight change.  HENT: Negative for congestion and sinus pressure.   Eyes: Negative for pain and visual disturbance.  Respiratory: Negative for cough, chest tightness and shortness of breath.   Cardiovascular: Negative for chest pain, palpitations and leg swelling.  Gastrointestinal: Negative for abdominal pain, diarrhea, nausea and vomiting.  Genitourinary: Negative for difficulty urinating and dysuria.  Musculoskeletal: Negative for joint swelling and myalgias.  Skin: Negative for color change and rash.  Neurological: Negative for dizziness, light-headedness and headaches.  Hematological: Negative for adenopathy. Does not bruise/bleed easily.  Psychiatric/Behavioral: Negative for agitation and dysphoric mood.       Objective:    Physical Exam Constitutional:      General: He is not in acute distress.    Appearance: Normal appearance. He is well-developed.  HENT:     Head: Normocephalic and atraumatic.     Right Ear: External ear normal.     Left Ear: External ear normal.  Eyes:     General: No scleral icterus.       Right eye: No discharge.        Left eye: No discharge.     Conjunctiva/sclera: Conjunctivae normal.  Neck:     Thyroid: No thyromegaly.  Cardiovascular:     Rate and Rhythm: Normal rate and regular rhythm.  Pulmonary:     Effort: No respiratory distress.     Breath sounds: Normal breath sounds. No wheezing.  Abdominal:     General: Bowel sounds are normal.     Palpations: Abdomen is soft.     Tenderness: There is no abdominal tenderness.  Genitourinary:    Comments: Not performed.  Musculoskeletal:        General: No tenderness.     Cervical back: Neck supple. No tenderness.  Lymphadenopathy:     Cervical: No cervical adenopathy.  Skin:    Findings: No erythema or rash.  Neurological:     Mental Status: He is alert and  oriented to person, place, and time.  Psychiatric:        Mood and Affect: Mood normal.        Behavior: Behavior normal.     BP 122/70   Pulse 70   Temp (!) 97.3 F (36.3 C)   Resp 16   Ht 5\' 9"  (1.753 m)   Wt 195 lb 9.6 oz (88.7 kg)   SpO2 98%   BMI 28.89 kg/m  Wt Readings from Last 3 Encounters:  02/03/20 195 lb 9.6 oz (  88.7 kg)  03/22/18 196 lb 6.4 oz (89.1 kg)  08/30/17 195 lb 3.2 oz (88.5 kg)     Lab Results  Component Value Date   WBC 6.0 01/29/2020   HGB 15.5 01/29/2020   HCT 45.9 01/29/2020   PLT 187.0 01/29/2020   GLUCOSE 146 (H) 01/29/2020   CHOL 183 01/29/2020   TRIG 100.0 01/29/2020   HDL 35.30 (L) 01/29/2020   LDLCALC 128 (H) 01/29/2020   ALT 18 01/29/2020   AST 14 01/29/2020   NA 138 01/29/2020   K 4.2 01/29/2020   CL 103 01/29/2020   CREATININE 0.99 01/29/2020   BUN 12 01/29/2020   CO2 28 01/29/2020   TSH 1.52 01/29/2020   PSA 1.33 01/29/2020   HGBA1C 7.4 (H) 01/29/2020   MICROALBUR <0.7 01/29/2020    US Venous Img Lower Unilateral Left  Result Date: 03/15/2017 CLINICAL DATA:  54 year old male with left lower extremity pain, recent travel. Personal history of left lower extremity DVT in 2008. EXAM: LEFT LOWER EXTREMITY VENOUS DOPPLER ULTRASOUND TECHNIQUE: Gray-scale sonography with graded compression, as well as color Doppler and duplex ultrasound were performed to evaluate the lower extremity deep venous systems from the level of the common femoral vein and including the common femoral, femoral, profunda femoral, popliteal and calf veins including the posterior tibial, peroneal and gastrocnemius veins when visible. The superficial great saphenous vein was also interrogated. Spectral Doppler was utilized to evaluate flow at rest and with distal augmentation maneuvers in the common femoral, femoral and popliteal veins. COMPARISON:  04/28/2008 and earlier FINDINGS: Contralateral Common Femoral Vein: Respiratory phasicity is normal and symmetric with  the symptomatic side. No evidence of thrombus. Normal compressibility. Common Femoral Vein: No evidence of thrombus. Normal compressibility, respiratory phasicity and response to augmentation. Saphenofemoral Junction: No evidence of thrombus. Normal compressibility and flow on color Doppler imaging. Profunda Femoral Vein: No evidence of thrombus. Normal compressibility and flow on color Doppler imaging. Femoral Vein: No evidence of thrombus. Normal compressibility, respiratory phasicity and response to augmentation. Popliteal Vein: No evidence of thrombus. Normal compressibility, respiratory phasicity and response to augmentation. Calf Veins: No evidence of thrombus. Normal compressibility and flow on color Doppler imaging. Superficial Great Saphenous Vein: No evidence of thrombus. Normal compressibility and flow on color Doppler imaging. Venous Reflux:  None. Other Findings:  None. IMPRESSION: No evidence of left lower extremity deep venous thrombosis. Electronically Signed   By: Odessa Fleming M.D.   On: 03/15/2017 12:13       Assessment & Plan:   Problem List Items Addressed This Visit    Diabetes mellitus without complication (HCC)    Recent a1c elevated as outlined.  Discussed diet and exercise.  Start metformin XR as directed.  Follow sugars.  Follow metabolic panel and a1c.        Relevant Medications   rosuvastatin (CRESTOR) 5 MG tablet   metFORMIN (GLUCOPHAGE XR) 500 MG 24 hr tablet   Other Relevant Orders   Hepatic function panel   Basic metabolic panel   Essential hypertension, benign    Blood pressure doing well on avapro and amlodipine.  Follow pressures.  Follow metabolic panel.       Relevant Medications   rosuvastatin (CRESTOR) 5 MG tablet   Healthcare maintenance    Physical today 02/03/20.  colonoscopy 12/2016.  Recommended f/u in 12/2021.  PSA 01/29/20 - 1.33.        Hypercholesterolemia    Discussed starting cholesterol medication and recommendation to start.  He is  in agreement.   Start crestor as directed.  Follow lipid panel and liver function tests.        Relevant Medications   rosuvastatin (CRESTOR) 5 MG tablet    Other Visit Diagnoses    Routine general medical examination at a health care facility    -  Primary       Dale , MD

## 2020-02-03 NOTE — Assessment & Plan Note (Addendum)
Physical today 02/03/20.  colonoscopy 12/2016.  Recommended f/u in 12/2021.  PSA 01/29/20 - 1.33.

## 2020-02-04 ENCOUNTER — Other Ambulatory Visit: Payer: Self-pay | Admitting: Internal Medicine

## 2020-02-04 LAB — HM DIABETES FOOT EXAM

## 2020-02-08 ENCOUNTER — Encounter: Payer: Self-pay | Admitting: Internal Medicine

## 2020-02-08 NOTE — Assessment & Plan Note (Signed)
Discussed starting cholesterol medication and recommendation to start.  He is in agreement.  Start crestor as directed.  Follow lipid panel and liver function tests.

## 2020-02-08 NOTE — Assessment & Plan Note (Signed)
Recent a1c elevated as outlined.  Discussed diet and exercise.  Start metformin XR as directed.  Follow sugars.  Follow metabolic panel and a1c.

## 2020-02-08 NOTE — Assessment & Plan Note (Signed)
Blood pressure doing well on avapro and amlodipine.  Follow pressures.  Follow metabolic panel.

## 2020-03-14 ENCOUNTER — Other Ambulatory Visit: Payer: Self-pay | Admitting: Internal Medicine

## 2020-03-15 ENCOUNTER — Other Ambulatory Visit: Payer: Self-pay | Admitting: Internal Medicine

## 2020-03-15 ENCOUNTER — Other Ambulatory Visit: Payer: Self-pay

## 2020-03-15 MED ORDER — AMLODIPINE BESYLATE 5 MG PO TABS: 5.0000 mg | ORAL_TABLET | Freq: Every day | ORAL | 0 refills | Status: DC

## 2020-03-16 ENCOUNTER — Other Ambulatory Visit: Payer: Self-pay

## 2020-03-16 ENCOUNTER — Encounter: Payer: Self-pay | Admitting: Internal Medicine

## 2020-03-16 ENCOUNTER — Other Ambulatory Visit (INDEPENDENT_AMBULATORY_CARE_PROVIDER_SITE_OTHER): Payer: BC Managed Care – PPO

## 2020-03-16 DIAGNOSIS — E119 Type 2 diabetes mellitus without complications: Secondary | ICD-10-CM | POA: Diagnosis not present

## 2020-03-16 LAB — BASIC METABOLIC PANEL
BUN: 19 mg/dL (ref 6–23)
CO2: 29 mEq/L (ref 19–32)
Calcium: 9.5 mg/dL (ref 8.4–10.5)
Chloride: 104 mEq/L (ref 96–112)
Creatinine, Ser: 1.15 mg/dL (ref 0.40–1.50)
GFR: 65.88 mL/min (ref >60.00)
Glucose, Bld: 143 mg/dL — ABNORMAL HIGH (ref 70–99)
Potassium: 3.9 mEq/L (ref 3.5–5.1)
Sodium: 140 mEq/L (ref 135–145)

## 2020-03-16 LAB — HEPATIC FUNCTION PANEL
ALT: 15 U/L (ref 0–53)
AST: 15 U/L (ref 0–37)
Albumin: 4.5 g/dL (ref 3.5–5.2)
Alkaline Phosphatase: 54 U/L (ref 39–117)
Bilirubin, Direct: 0.1 mg/dL (ref 0.0–0.3)
Total Bilirubin: 0.7 mg/dL (ref 0.2–1.2)
Total Protein: 6.4 g/dL (ref 6.0–8.3)

## 2020-03-17 ENCOUNTER — Other Ambulatory Visit: Payer: Self-pay | Admitting: Internal Medicine

## 2020-03-17 DIAGNOSIS — E119 Type 2 diabetes mellitus without complications: Secondary | ICD-10-CM

## 2020-03-17 NOTE — Progress Notes (Signed)
Order placed for f/u lab.   

## 2020-03-31 ENCOUNTER — Other Ambulatory Visit: Payer: Self-pay | Admitting: Internal Medicine

## 2020-04-21 ENCOUNTER — Other Ambulatory Visit: Payer: Self-pay

## 2020-04-21 ENCOUNTER — Other Ambulatory Visit (INDEPENDENT_AMBULATORY_CARE_PROVIDER_SITE_OTHER): Payer: BC Managed Care – PPO

## 2020-04-21 DIAGNOSIS — E119 Type 2 diabetes mellitus without complications: Secondary | ICD-10-CM | POA: Diagnosis not present

## 2020-04-22 LAB — BASIC METABOLIC PANEL
BUN: 17 mg/dL (ref 6–23)
CO2: 28 mEq/L (ref 19–32)
Calcium: 9.5 mg/dL (ref 8.4–10.5)
Chloride: 102 mEq/L (ref 96–112)
Creatinine, Ser: 1.12 mg/dL (ref 0.40–1.50)
GFR: 67.89 mL/min (ref >60.00)
Glucose, Bld: 99 mg/dL (ref 70–99)
Potassium: 4.1 mEq/L (ref 3.5–5.1)
Sodium: 137 mEq/L (ref 135–145)

## 2020-04-23 ENCOUNTER — Encounter: Payer: Self-pay | Admitting: Internal Medicine

## 2020-05-08 ENCOUNTER — Other Ambulatory Visit: Payer: Self-pay | Admitting: Internal Medicine

## 2020-05-10 ENCOUNTER — Ambulatory Visit: Payer: BC Managed Care – PPO | Admitting: Internal Medicine

## 2020-05-24 ENCOUNTER — Encounter: Payer: Self-pay | Admitting: Internal Medicine

## 2020-05-24 ENCOUNTER — Telehealth (INDEPENDENT_AMBULATORY_CARE_PROVIDER_SITE_OTHER): Payer: BC Managed Care – PPO | Admitting: Internal Medicine

## 2020-05-24 ENCOUNTER — Other Ambulatory Visit: Payer: Self-pay

## 2020-05-24 DIAGNOSIS — E119 Type 2 diabetes mellitus without complications: Secondary | ICD-10-CM

## 2020-05-24 DIAGNOSIS — I10 Essential (primary) hypertension: Secondary | ICD-10-CM | POA: Diagnosis not present

## 2020-05-24 DIAGNOSIS — F439 Reaction to severe stress, unspecified: Secondary | ICD-10-CM | POA: Diagnosis not present

## 2020-05-24 DIAGNOSIS — E7800 Pure hypercholesterolemia, unspecified: Secondary | ICD-10-CM

## 2020-05-24 MED ORDER — METFORMIN HCL 500 MG PO TABS: 500.0000 mg | ORAL_TABLET | Freq: Two times a day (BID) | ORAL | 1 refills | Status: DC

## 2020-05-24 NOTE — Progress Notes (Signed)
Patient ID: Jackson Cooper, male   DOB: September 08, 1966, 54 y.o.   MRN: [id number]   Virtual Visit via video Note  This visit type was conducted due to national recommendations for restrictions regarding the COVID-19 pandemic (e.g. social distancing).  This format is felt to be most appropriate for this patient at this time.  All issues noted in this document were discussed and addressed.  No physical exam was performed (except for noted visual exam findings with Video Visits).   I connected with Jackson Cooper by a video enabled telemedicine application and verified that I am speaking with the correct person using two identifiers. Location patient: home Location provider: work Persons participating in the virtual visit: patient, provider  The limitations, risks, security and privacy concerns of performing an evaluation and management service by video and the availability of in person appointments have been discussed.   It has also been discussed with the patient that there may be a patient responsible charge related to this service. The patient has expressed understanding and has agreed to proceed.   Reason for visit: scheduled follow up.    HPI: Follow up regarding his sugar, cholesterol and blood pressure.  States he is doing well.  New job.  Different stress.  Overall doing well.  Handling stress.  Daughter having twins.  Stays active.  No chest pain or sob reported.  No abdominal pain or bowel change reported.  Taking metformin XR.  Feels more constipated.  Feels related to the medication.  Wants to change to regular strength metformin.  Stats am sugars 140s.   Is monitoring carb intake.  Taking crestor 3 days per week.  Tolerating. Walking.     ROS: See pertinent positives and negatives per HPI.  Past Medical History:  Diagnosis Date   Diabetes mellitus without complication ([place])    DVT (deep venous thrombosis) ([place]) Diagnosed 9/08   Encephalitis 1970   Enlarged prostate     Hyperglycemia    Hyperlipidemia    Hypertension    Nephrolithiasis     Past Surgical History:  Procedure Laterality Date   EYE SURGERY  1972    Family History  Problem Relation Age of Onset   Hypertension Mother    Breast cancer Sister     SOCIAL HX: reviewed.    Current Outpatient Medications:    amLODipine (NORVASC) 5 MG tablet, Take 1 tablet (5 mg total) by mouth daily., Disp: 30 tablet, Rfl: 0   aspirin 81 MG tablet, Take 81 mg by mouth daily. Take 1 tablet by mouth once a day, Disp: , Rfl:    B Complex Vitamins (VITAMIN B-COMPLEX PO), Take 1 tablet by mouth daily., Disp: , Rfl:    cetirizine (ZYRTEC) 10 MG tablet, Take 10 mg by mouth daily as needed. , Disp: , Rfl:    Flaxseed Oil OIL, Take 1,000 mg by mouth daily. 1000 mg , Disp: , Rfl:    fluticasone (FLONASE) 50 MCG/ACT nasal spray, Place 2 sprays into the nose daily., Disp: 16 g, Rfl: 2   irbesartan (AVAPRO) 150 MG tablet, Take 1 tablet (150 mg total) by mouth daily., Disp: 30 tablet, Rfl: 5   Krill Oil 350 MG CAPS, Take 1 capsule by mouth daily., Disp: , Rfl:    LORazepam (ATIVAN) 0.5 MG tablet, Take 0.5 mg by mouth every 8 (eight) hours as needed. 1 tablet as directed , Disp: , Rfl:    metFORMIN (GLUCOPHAGE) 500 MG tablet, Take 1 tablet (500 mg total) by  mouth 2 (two) times daily with a meal., Disp: 180 tablet, Rfl: 1   rosuvastatin (CRESTOR) 5 MG tablet, Take 1 tablet by mouth on Monday, Wednesday and Friday, Disp: 15 tablet, Rfl: 1  EXAM:  GENERAL: alert, oriented, appears well and in no acute distress  HEENT: atraumatic, conjunttiva clear, no obvious abnormalities on inspection of external nose and ears  NECK: normal movements of the head and neck  LUNGS: on inspection no signs of respiratory distress, breathing rate appears normal, no obvious gross SOB, gasping or wheezing  CV: no obvious cyanosis  PSYCH/NEURO: pleasant and cooperative, no obvious depression or anxiety, speech and thought  processing grossly intact  ASSESSMENT AND PLAN:  Discussed the following assessment and plan:  Diabetes mellitus without complication (HCC) Is monitoring carb intake.  Sugars as outlined.  Will change metformin XR to regular metformin.  Follow sugars.  Follow bowels.  Check met b and a1c.  Low carb diet and exercise.    Stress Overall appears to be handling things relatively well.  Follow.    Hypercholesterolemia Taking crestor 3 days per week.  Low cholesterol diet and exercise.  Follow lipid panel and liver function tests.    Essential hypertension, benign Not checking pressures regularly.  Continue avapro and amlodipine.  Follow pressures.  Follow metabolic panel.    Orders Placed This Encounter  Procedures   Hemoglobin A1c    Standing Status:   Future    Standing Expiration Date:   05/31/2021   Hepatic function panel    Standing Status:   Future    Standing Expiration Date:   05/31/2021   Lipid panel    Standing Status:   Future    Standing Expiration Date:   04/01/2079   Basic metabolic panel    Standing Status:   Future    Standing Expiration Date:   05/31/2021    Meds ordered this encounter  Medications   metFORMIN (GLUCOPHAGE) 500 MG tablet    Sig: Take 1 tablet (500 mg total) by mouth 2 (two) times daily with a meal.    Dispense:  180 tablet    Refill:  1     I discussed the assessment and treatment plan with the patient. The patient was provided an opportunity to ask questions and all were answered. The patient agreed with the plan and demonstrated an understanding of the instructions.   The patient was advised to call back or seek an in-person evaluation if the symptoms worsen or if the condition fails to improve as anticipated.   Einar Pheasant, MD

## 2020-05-31 ENCOUNTER — Encounter: Payer: Self-pay | Admitting: Internal Medicine

## 2020-05-31 NOTE — Assessment & Plan Note (Signed)
Not checking pressures regularly.  Continue avapro and amlodipine.  Follow pressures.  Follow metabolic panel.

## 2020-05-31 NOTE — Assessment & Plan Note (Signed)
Overall appears to be handling things relatively well.  Follow.   

## 2020-05-31 NOTE — Assessment & Plan Note (Signed)
Is monitoring carb intake.  Sugars as outlined.  Will change metformin XR to regular metformin.  Follow sugars.  Follow bowels.  Check met b and a1c.  Low carb diet and exercise.

## 2020-05-31 NOTE — Assessment & Plan Note (Signed)
Taking crestor 3 days per week.  Low cholesterol diet and exercise.  Follow lipid panel and liver function tests.

## 2020-06-04 ENCOUNTER — Other Ambulatory Visit: Payer: Self-pay | Admitting: Internal Medicine

## 2020-06-21 ENCOUNTER — Other Ambulatory Visit: Payer: Self-pay

## 2020-06-21 MED ORDER — METFORMIN HCL 500 MG PO TABS: 500.0000 mg | ORAL_TABLET | Freq: Two times a day (BID) | ORAL | 1 refills | Status: DC

## 2020-06-29 ENCOUNTER — Other Ambulatory Visit: Payer: BC Managed Care – PPO

## 2020-07-04 ENCOUNTER — Other Ambulatory Visit: Payer: Self-pay | Admitting: Internal Medicine

## 2020-07-05 ENCOUNTER — Other Ambulatory Visit: Payer: Self-pay | Admitting: Internal Medicine

## 2020-08-09 ENCOUNTER — Other Ambulatory Visit: Payer: Self-pay | Admitting: Internal Medicine

## 2020-08-29 ENCOUNTER — Other Ambulatory Visit: Payer: Self-pay | Admitting: Internal Medicine

## 2020-08-30 ENCOUNTER — Other Ambulatory Visit: Payer: Self-pay | Admitting: Internal Medicine

## 2020-08-31 ENCOUNTER — Ambulatory Visit: Payer: BC Managed Care – PPO | Admitting: Internal Medicine

## 2020-09-09 ENCOUNTER — Other Ambulatory Visit: Payer: Self-pay | Admitting: Internal Medicine

## 2020-09-27 ENCOUNTER — Ambulatory Visit: Payer: BC Managed Care – PPO | Admitting: Internal Medicine

## 2020-09-28 ENCOUNTER — Other Ambulatory Visit: Payer: Self-pay

## 2020-09-28 ENCOUNTER — Ambulatory Visit (INDEPENDENT_AMBULATORY_CARE_PROVIDER_SITE_OTHER): Payer: BC Managed Care – PPO | Admitting: Internal Medicine

## 2020-09-28 DIAGNOSIS — E7800 Pure hypercholesterolemia, unspecified: Secondary | ICD-10-CM

## 2020-09-28 DIAGNOSIS — E1159 Type 2 diabetes mellitus with other circulatory complications: Secondary | ICD-10-CM

## 2020-09-28 DIAGNOSIS — I152 Hypertension secondary to endocrine disorders: Secondary | ICD-10-CM

## 2020-09-28 DIAGNOSIS — I10 Essential (primary) hypertension: Secondary | ICD-10-CM

## 2020-09-28 DIAGNOSIS — F439 Reaction to severe stress, unspecified: Secondary | ICD-10-CM

## 2020-09-28 NOTE — Progress Notes (Signed)
Patient ID: Jackson Cooper, male   DOB: [birth year], 54 y.o.   MRN: [id number]   Subjective:    Patient ID: Jackson Cooper, male    DOB: 1966-09-21, 54 y.o.   MRN: [id number]  HPI This visit occurred during the SARS-CoV-2 public health emergency.  Safety protocols were in place, including screening questions prior to the visit, additional usage of staff PPE, and extensive cleaning of exam room while observing appropriate contact time as indicated for disinfecting solutions.  Patient here for a scheduled follow up.  Here to f/u regarding his blood sugar, blood pressure and cholesterol.  His new position at work is stressful, but he is handling things well.  Staying active.  Has lost weight.  Watching his diet.  No chest pain or sob reported.  No acid reflux or abdominal pain.  Bowels moving.  Does not check his blood sugars.  Has noticed a "knot" - bottom of right foot.  Present for months.  Not tender.  Has not changed.     Past Medical History:  Diagnosis Date  . Diabetes mellitus without complication ([place])   . DVT (deep venous thrombosis) ([place]) Diagnosed 9/08  . Encephalitis 1970  . Enlarged prostate   . Hyperglycemia   . Hyperlipidemia   . Hypertension   . Nephrolithiasis    Past Surgical History:  Procedure Laterality Date  . EYE SURGERY  1972   Family History  Problem Relation Age of Onset  . Hypertension Mother   . Breast cancer Sister    Social History   Socioeconomic History  . Marital status: Married    Spouse name: Not on file  . Number of children: Not on file  . Years of education: Not on file  . Highest education level: Not on file  Occupational History  . Not on file  Tobacco Use  . Smoking status: Never Smoker  . Smokeless tobacco: Never Used  Substance and Sexual Activity  . Alcohol use: No    Alcohol/week: 0.0 standard drinks  . Drug use: No  . Sexual activity: Not on file  Other Topics Concern  . Not on file  Social History Narrative  . Not on file     Social Determinants of Health   Financial Resource Strain:   . Difficulty of Paying Living Expenses: Not on file  Food Insecurity:   . Worried About Charity fundraiser in the Last Year: Not on file  . Ran Out of Food in the Last Year: Not on file  Transportation Needs:   . Lack of Transportation (Medical): Not on file  . Lack of Transportation (Non-Medical): Not on file  Physical Activity:   . Days of Exercise per Week: Not on file  . Minutes of Exercise per Session: Not on file  Stress:   . Feeling of Stress : Not on file  Social Connections:   . Frequency of Communication with Friends and Family: Not on file  . Frequency of Social Gatherings with Friends and Family: Not on file  . Attends Religious Services: Not on file  . Active Member of Clubs or Organizations: Not on file  . Attends Archivist Meetings: Not on file  . Marital Status: Not on file    Outpatient Encounter Medications as of 09/28/2020  Medication Sig  . amLODipine (NORVASC) 5 MG tablet Take 1 tablet (5 mg total) by mouth daily.  [name] aspirin 81 MG tablet Take 81 mg by mouth daily. Take 1 tablet by  mouth once a day  . B Complex Vitamins (VITAMIN B-COMPLEX PO) Take 1 tablet by mouth daily.  . cetirizine (ZYRTEC) 10 MG tablet Take 10 mg by mouth daily as needed.   . Flaxseed Oil OIL Take 1,000 mg by mouth daily. 1000 mg   . fluticasone (FLONASE) 50 MCG/ACT nasal spray Place 2 sprays into the nose daily.  . irbesartan (AVAPRO) 150 MG tablet Take 1 tablet (150 mg total) by mouth daily.  Javier Docker Oil 350 MG CAPS Take 1 capsule by mouth daily.  [name] LORazepam (ATIVAN) 0.5 MG tablet Take 0.5 mg by mouth every 8 (eight) hours as needed. 1 tablet as directed   . metFORMIN (GLUCOPHAGE) 500 MG tablet Take 1 tablet (500 mg total) by mouth 2 (two) times daily with a meal.  . rosuvastatin (CRESTOR) 5 MG tablet Take 1 tablet by mouth on Monday, Wednesday and Friday  . rosuvastatin (CRESTOR) 5 MG tablet Take 1 tablet by  mouth on Monday, Wednesday and Friday   No facility-administered encounter medications on file as of 09/28/2020.    Review of Systems  Constitutional: Negative for appetite change and unexpected weight change.  HENT: Negative for congestion and sinus pressure.   Respiratory: Negative for cough, chest tightness and shortness of breath.   Cardiovascular: Negative for chest pain, palpitations and leg swelling.  Gastrointestinal: Negative for abdominal pain, diarrhea, nausea and vomiting.  Genitourinary: Negative for difficulty urinating and dysuria.  Musculoskeletal: Negative for joint swelling and myalgias.  Neurological: Negative for dizziness, light-headedness and headaches.  Psychiatric/Behavioral: Negative for agitation and dysphoric mood.       Objective:    Physical Exam Vitals reviewed.  Constitutional:      General: He is not in acute distress.    Appearance: Normal appearance. He is well-developed.  HENT:     Head: Normocephalic and atraumatic.     Right Ear: External ear normal.     Left Ear: External ear normal.  Eyes:     General: No scleral icterus.       Right eye: No discharge.        Left eye: No discharge.     Conjunctiva/sclera: Conjunctivae normal.  Cardiovascular:     Rate and Rhythm: Normal rate and regular rhythm.  Pulmonary:     Effort: Pulmonary effort is normal. No respiratory distress.     Breath sounds: Normal breath sounds.  Abdominal:     General: Bowel sounds are normal.     Palpations: Abdomen is soft.     Tenderness: There is no abdominal tenderness.  Musculoskeletal:        General: No swelling or tenderness.     Cervical back: Neck supple. No tenderness.  Lymphadenopathy:     Cervical: No cervical adenopathy.  Skin:    Findings: No erythema or rash.  Neurological:     Mental Status: He is alert.  Psychiatric:        Mood and Affect: Mood normal.        Behavior: Behavior normal.     BP 126/70   Pulse 60   Temp 98.5 F (36.9  C) (Oral)   Resp 16   Ht '5\' 9"'  (1.753 m)   Wt 190 lb 9.6 oz (86.5 kg)   SpO2 98%   BMI 28.15 kg/m  Wt Readings from Last 3 Encounters:  09/28/20 190 lb 9.6 oz (86.5 kg)  05/24/20 201 lb (91.2 kg)  02/03/20 195 lb 9.6 oz (88.7 kg)  Lab Results  Component Value Date   WBC 6.0 01/29/2020   HGB 15.5 01/29/2020   HCT 45.9 01/29/2020   PLT 187.0 01/29/2020   GLUCOSE 99 04/21/2020   CHOL 183 01/29/2020   TRIG 100.0 01/29/2020   HDL 35.30 (L) 01/29/2020   LDLCALC 128 (H) 01/29/2020   ALT 15 03/16/2020   AST 15 03/16/2020   NA 137 04/21/2020   K 4.1 04/21/2020   CL 102 04/21/2020   CREATININE 1.12 04/21/2020   BUN 17 04/21/2020   CO2 28 04/21/2020   TSH 1.52 01/29/2020   PSA 1.33 01/29/2020   HGBA1C 7.4 (H) 01/29/2020   MICROALBUR <0.7 01/29/2020    US Venous Img Lower Unilateral Left  Result Date: 03/15/2017 CLINICAL DATA:  54 year old male with left lower extremity pain, recent travel. Personal history of left lower extremity DVT in 2008. EXAM: LEFT LOWER EXTREMITY VENOUS DOPPLER ULTRASOUND TECHNIQUE: Gray-scale sonography with graded compression, as well as color Doppler and duplex ultrasound were performed to evaluate the lower extremity deep venous systems from the level of the common femoral vein and including the common femoral, femoral, profunda femoral, popliteal and calf veins including the posterior tibial, peroneal and gastrocnemius veins when visible. The superficial great saphenous vein was also interrogated. Spectral Doppler was utilized to evaluate flow at rest and with distal augmentation maneuvers in the common femoral, femoral and popliteal veins. COMPARISON:  04/28/2008 and earlier FINDINGS: Contralateral Common Femoral Vein: Respiratory phasicity is normal and symmetric with the symptomatic side. No evidence of thrombus. Normal compressibility. Common Femoral Vein: No evidence of thrombus. Normal compressibility, respiratory phasicity and response to  augmentation. Saphenofemoral Junction: No evidence of thrombus. Normal compressibility and flow on color Doppler imaging. Profunda Femoral Vein: No evidence of thrombus. Normal compressibility and flow on color Doppler imaging. Femoral Vein: No evidence of thrombus. Normal compressibility, respiratory phasicity and response to augmentation. Popliteal Vein: No evidence of thrombus. Normal compressibility, respiratory phasicity and response to augmentation. Calf Veins: No evidence of thrombus. Normal compressibility and flow on color Doppler imaging. Superficial Great Saphenous Vein: No evidence of thrombus. Normal compressibility and flow on color Doppler imaging. Venous Reflux:  None. Other Findings:  None. IMPRESSION: No evidence of left lower extremity deep venous thrombosis. Electronically Signed   By: Genevie Ann M.D.   On: 03/15/2017 12:13       Assessment & Plan:   Problem List Items Addressed This Visit    Stress    Overall appears to be handling things relatively well.  Follow.       Hypertension associated with type 2 diabetes mellitus (HCC)    Low carb diet and exercise.  Follow met b and a1c.        Hypercholesterolemia    On crestor.  Low cholesterol diet and exercise.  Follow lipid panel and liver function tests.        Essential hypertension, benign    Continue avapro and amlodipine.  Blood pressure as outlined.  Follow pressures. Follow metabolic panel.           Einar Pheasant, MD

## 2020-10-04 ENCOUNTER — Encounter: Payer: Self-pay | Admitting: Internal Medicine

## 2020-10-04 NOTE — Assessment & Plan Note (Signed)
Low carb diet and exercise.  Follow met b and a1c.   

## 2020-10-04 NOTE — Assessment & Plan Note (Signed)
Overall appears to be handling things relatively well.  Follow.   

## 2020-10-04 NOTE — Assessment & Plan Note (Signed)
On crestor.  Low cholesterol diet and exercise.  Follow lipid panel and liver function tests.   

## 2020-10-04 NOTE — Assessment & Plan Note (Signed)
Continue avapro and amlodipine.  Blood pressure as outlined.  Follow pressures. Follow metabolic panel.  

## 2020-10-13 ENCOUNTER — Other Ambulatory Visit: Payer: Self-pay

## 2020-10-13 MED ORDER — IRBESARTAN 150 MG PO TABS: ORAL_TABLET | ORAL | 0 refills | Status: DC

## 2020-10-13 MED ORDER — AMLODIPINE BESYLATE 5 MG PO TABS
5.0000 mg | ORAL_TABLET | Freq: Every day | ORAL | 0 refills | Status: DC
Start: 2020-10-13 — End: 2020-11-08

## 2020-10-15 ENCOUNTER — Other Ambulatory Visit: Payer: BC Managed Care – PPO

## 2020-10-20 ENCOUNTER — Other Ambulatory Visit (INDEPENDENT_AMBULATORY_CARE_PROVIDER_SITE_OTHER): Payer: BC Managed Care – PPO

## 2020-10-20 ENCOUNTER — Other Ambulatory Visit: Payer: Self-pay

## 2020-10-20 DIAGNOSIS — E7800 Pure hypercholesterolemia, unspecified: Secondary | ICD-10-CM | POA: Diagnosis not present

## 2020-10-20 DIAGNOSIS — E119 Type 2 diabetes mellitus without complications: Secondary | ICD-10-CM | POA: Diagnosis not present

## 2020-10-20 LAB — BASIC METABOLIC PANEL
BUN: 14 mg/dL (ref 6–23)
CO2: 28 mEq/L (ref 19–32)
Calcium: 9.5 mg/dL (ref 8.4–10.5)
Chloride: 104 mEq/L (ref 96–112)
Creatinine, Ser: 1.11 mg/dL (ref 0.40–1.50)
GFR: 74.39 mL/min (ref >60.00)
Glucose, Bld: 117 mg/dL — ABNORMAL HIGH (ref 70–99)
Potassium: 4.5 mEq/L (ref 3.5–5.1)
Sodium: 138 mEq/L (ref 135–145)

## 2020-10-20 LAB — HEPATIC FUNCTION PANEL
ALT: 15 U/L (ref 0–53)
AST: 13 U/L (ref 0–37)
Albumin: 4.4 g/dL (ref 3.5–5.2)
Alkaline Phosphatase: 49 U/L (ref 39–117)
Bilirubin, Direct: 0.2 mg/dL (ref 0.0–0.3)
Total Bilirubin: 1 mg/dL (ref 0.2–1.2)
Total Protein: 6.5 g/dL (ref 6.0–8.3)

## 2020-10-20 LAB — LIPID PANEL
Cholesterol: 145 mg/dL (ref 0–200)
HDL: 36.4 mg/dL — ABNORMAL LOW (ref >39.00)
LDL Cholesterol: 92 mg/dL (ref 0–99)
NonHDL: 108.35
Total CHOL/HDL Ratio: 4
Triglycerides: 83 mg/dL (ref 0.0–149.0)
VLDL: 16.6 mg/dL (ref 0.0–40.0)

## 2020-10-20 LAB — HEMOGLOBIN A1C: Hgb A1c MFr Bld: 6.6 % — ABNORMAL HIGH (ref 4.6–6.5)

## 2020-11-02 ENCOUNTER — Other Ambulatory Visit: Payer: Self-pay | Admitting: Internal Medicine

## 2020-11-05 ENCOUNTER — Other Ambulatory Visit: Payer: Self-pay | Admitting: Internal Medicine

## 2020-11-07 ENCOUNTER — Other Ambulatory Visit: Payer: Self-pay | Admitting: Internal Medicine

## 2020-11-25 ENCOUNTER — Other Ambulatory Visit: Payer: Self-pay | Admitting: Internal Medicine

## 2020-12-09 ENCOUNTER — Other Ambulatory Visit: Payer: Self-pay | Admitting: Internal Medicine

## 2021-01-05 ENCOUNTER — Other Ambulatory Visit: Payer: Self-pay | Admitting: Internal Medicine

## 2021-01-11 ENCOUNTER — Other Ambulatory Visit: Payer: Self-pay | Admitting: Internal Medicine

## 2021-01-26 ENCOUNTER — Ambulatory Visit: Payer: BC Managed Care – PPO | Admitting: Internal Medicine

## 2021-01-27 ENCOUNTER — Ambulatory Visit (INDEPENDENT_AMBULATORY_CARE_PROVIDER_SITE_OTHER): Payer: BC Managed Care – PPO | Admitting: Internal Medicine

## 2021-01-27 ENCOUNTER — Other Ambulatory Visit: Payer: Self-pay

## 2021-01-27 ENCOUNTER — Encounter: Payer: Self-pay | Admitting: Internal Medicine

## 2021-01-27 VITALS — BP 110/68 | HR 72 | Temp 98.7°F | Ht 69.0 in | Wt 196.1 lb

## 2021-01-27 DIAGNOSIS — I152 Hypertension secondary to endocrine disorders: Secondary | ICD-10-CM

## 2021-01-27 DIAGNOSIS — E7800 Pure hypercholesterolemia, unspecified: Secondary | ICD-10-CM

## 2021-01-27 DIAGNOSIS — Z0000 Encounter for general adult medical examination without abnormal findings: Secondary | ICD-10-CM | POA: Diagnosis not present

## 2021-01-27 DIAGNOSIS — N200 Calculus of kidney: Secondary | ICD-10-CM

## 2021-01-27 DIAGNOSIS — E1159 Type 2 diabetes mellitus with other circulatory complications: Secondary | ICD-10-CM | POA: Diagnosis not present

## 2021-01-27 DIAGNOSIS — I10 Essential (primary) hypertension: Secondary | ICD-10-CM

## 2021-01-27 DIAGNOSIS — Z125 Encounter for screening for malignant neoplasm of prostate: Secondary | ICD-10-CM

## 2021-01-27 NOTE — Progress Notes (Signed)
Patient ID: Jackson Cooper, male   DOB: August 11, 1966, 55 y.o.   MRN: [id number]   Subjective:    Patient ID: Jackson Cooper, male    DOB: 07-Feb-1966, 55 y.o.   MRN: [id number]  HPI This visit occurred during the SARS-CoV-2 public health emergency.  Safety protocols were in place, including screening questions prior to the visit, additional usage of staff PPE, and extensive cleaning of exam room while observing appropriate contact time as indicated for disinfecting solutions.  Patient here for his physical exam. States he is doing well.  Feels good.  Tries to stay active.  Trying to watch his diet.  Does not check sugars.  No chest pain or sob.  No acid reflux or abdominal pain reported.  Did pass kidney stone approximately four weeks ago.  No pain currently.  No blood in his urin.  Bowels stable.  States has had kidney stones since 1990.  Desires no further w/up.    Past Medical History:  Diagnosis Date  . Diabetes mellitus without complication ([place])   . DVT (deep venous thrombosis) ([place]) Diagnosed 9/08  . Encephalitis 1970  . Enlarged prostate   . Hyperglycemia   . Hyperlipidemia   . Hypertension   . Nephrolithiasis    Past Surgical History:  Procedure Laterality Date  . EYE SURGERY  1972   Family History  Problem Relation Age of Onset  . Hypertension Mother   . Breast cancer Sister    Social History   Socioeconomic History  . Marital status: Married    Spouse name: Not on file  . Number of children: Not on file  . Years of education: Not on file  . Highest education level: Not on file  Occupational History  . Not on file  Tobacco Use  . Smoking status: Never Smoker  . Smokeless tobacco: Never Used  Substance and Sexual Activity  . Alcohol use: No    Alcohol/week: 0.0 standard drinks  . Drug use: No  . Sexual activity: Not on file  Other Topics Concern  . Not on file  Social History Narrative  . Not on file   Social Determinants of Health   Financial Resource  Strain: Not on file  Food Insecurity: Not on file  Transportation Needs: Not on file  Physical Activity: Not on file  Stress: Not on file  Social Connections: Not on file    Outpatient Encounter Medications as of 01/27/2021  Medication Sig  . amLODipine (NORVASC) 5 MG tablet Take 1 tablet (5 mg total) by mouth daily.  [name] aspirin 81 MG tablet Take 81 mg by mouth daily. Take 1 tablet by mouth once a day  . B Complex Vitamins (VITAMIN B-COMPLEX PO) Take 1 tablet by mouth daily.  . cetirizine (ZYRTEC) 10 MG tablet Take 10 mg by mouth daily as needed.   . Flaxseed Oil OIL Take 1,000 mg by mouth daily. 1000 mg  . fluticasone (FLONASE) 50 MCG/ACT nasal spray Place 2 sprays into the nose daily.  . irbesartan (AVAPRO) 150 MG tablet Take 1 tablet by mouth once daily  . Krill Oil 350 MG CAPS Take 1 capsule by mouth daily.  [name] LORazepam (ATIVAN) 0.5 MG tablet Take 0.5 mg by mouth every 8 (eight) hours as needed. 1 tablet as directed  . metFORMIN (GLUCOPHAGE) 500 MG tablet Take 1 tablet (500 mg total) by mouth 2 (two) times daily with a meal.  . metFORMIN (GLUCOPHAGE-XR) 500 MG 24 hr tablet TAKE 1 TABLET BY  MOUTH TWICE DAILY WITH MEALS  . rosuvastatin (CRESTOR) 5 MG tablet Take 1 tablet by mouth on Monday, Wednesday and Friday  . rosuvastatin (CRESTOR) 5 MG tablet Take 1 tablet by mouth on Monday, Wednesday and Friday.   No facility-administered encounter medications on file as of 01/27/2021.    Review of Systems  Constitutional: Negative for appetite change and unexpected weight change.  HENT: Negative for congestion, sinus pressure and sore throat.   Eyes: Negative for pain and visual disturbance.  Respiratory: Negative for cough, chest tightness and shortness of breath.   Cardiovascular: Negative for chest pain, palpitations and leg swelling.  Gastrointestinal: Negative for abdominal pain, diarrhea, nausea and vomiting.  Genitourinary: Negative for difficulty urinating and dysuria.   Musculoskeletal: Negative for joint swelling and myalgias.  Skin: Negative for color change and rash.  Neurological: Negative for dizziness, light-headedness and headaches.  Hematological: Negative for adenopathy. Does not bruise/bleed easily.  Psychiatric/Behavioral: Negative for agitation and dysphoric mood.       Objective:    Physical Exam Vitals reviewed.  Constitutional:      General: He is not in acute distress.    Appearance: Normal appearance. He is well-developed and well-nourished.  HENT:     Head: Normocephalic and atraumatic.     Right Ear: External ear normal.     Left Ear: External ear normal.     Mouth/Throat:     Mouth: Oropharynx is clear and moist.  Eyes:     General:        Right eye: No discharge.        Left eye: No discharge.     Conjunctiva/sclera: Conjunctivae normal.  Neck:     Thyroid: No thyromegaly.  Cardiovascular:     Rate and Rhythm: Normal rate and regular rhythm.  Pulmonary:     Effort: No respiratory distress.     Breath sounds: Normal breath sounds. No wheezing.  Abdominal:     General: Bowel sounds are normal.     Palpations: Abdomen is soft.     Tenderness: There is no abdominal tenderness.  Genitourinary:    Comments: Rectal exam:  Slightly enlarged prostate.  Heme negative.  Musculoskeletal:        General: No swelling, tenderness or edema.     Cervical back: Neck supple. No tenderness.  Lymphadenopathy:     Cervical: No cervical adenopathy.  Skin:    Findings: No erythema or rash.  Neurological:     Mental Status: He is alert and oriented to person, place, and time.  Psychiatric:        Mood and Affect: Mood and affect and mood normal.        Behavior: Behavior normal.     BP 110/68   Pulse 72   Temp 98.7 F (37.1 C)   Ht '5\' 9"'  (1.753 m)   Wt 196 lb 2 oz (89 kg)   SpO2 96%   BMI 28.96 kg/m  Wt Readings from Last 3 Encounters:  01/27/21 196 lb 2 oz (89 kg)  09/28/20 190 lb 9.6 oz (86.5 kg)  05/24/20 201 lb  (91.2 kg)     Lab Results  Component Value Date   WBC 6.0 01/29/2020   HGB 15.5 01/29/2020   HCT 45.9 01/29/2020   PLT 187.0 01/29/2020   GLUCOSE 117 (H) 10/20/2020   CHOL 145 10/20/2020   TRIG 83.0 10/20/2020   HDL 36.40 (L) 10/20/2020   LDLCALC 92 10/20/2020   ALT 15 10/20/2020  AST 13 10/20/2020   NA 138 10/20/2020   K 4.5 10/20/2020   CL 104 10/20/2020   CREATININE 1.11 10/20/2020   BUN 14 10/20/2020   CO2 28 10/20/2020   TSH 1.52 01/29/2020   PSA 1.33 01/29/2020   HGBA1C 6.6 (H) 10/20/2020   MICROALBUR <0.7 01/29/2020    US Venous Img Lower Unilateral Left  Result Date: 03/15/2017 CLINICAL DATA:  55 year old male with left lower extremity pain, recent travel. Personal history of left lower extremity DVT in 2008. EXAM: LEFT LOWER EXTREMITY VENOUS DOPPLER ULTRASOUND TECHNIQUE: Gray-scale sonography with graded compression, as well as color Doppler and duplex ultrasound were performed to evaluate the lower extremity deep venous systems from the level of the common femoral vein and including the common femoral, femoral, profunda femoral, popliteal and calf veins including the posterior tibial, peroneal and gastrocnemius veins when visible. The superficial great saphenous vein was also interrogated. Spectral Doppler was utilized to evaluate flow at rest and with distal augmentation maneuvers in the common femoral, femoral and popliteal veins. COMPARISON:  04/28/2008 and earlier FINDINGS: Contralateral Common Femoral Vein: Respiratory phasicity is normal and symmetric with the symptomatic side. No evidence of thrombus. Normal compressibility. Common Femoral Vein: No evidence of thrombus. Normal compressibility, respiratory phasicity and response to augmentation. Saphenofemoral Junction: No evidence of thrombus. Normal compressibility and flow on color Doppler imaging. Profunda Femoral Vein: No evidence of thrombus. Normal compressibility and flow on color Doppler imaging. Femoral Vein:  No evidence of thrombus. Normal compressibility, respiratory phasicity and response to augmentation. Popliteal Vein: No evidence of thrombus. Normal compressibility, respiratory phasicity and response to augmentation. Calf Veins: No evidence of thrombus. Normal compressibility and flow on color Doppler imaging. Superficial Great Saphenous Vein: No evidence of thrombus. Normal compressibility and flow on color Doppler imaging. Venous Reflux:  None. Other Findings:  None. IMPRESSION: No evidence of left lower extremity deep venous thrombosis. Electronically Signed   By: Genevie Ann M.D.   On: 03/15/2017 12:13       Assessment & Plan:   Problem List Items Addressed This Visit    Essential hypertension, benign    Continue avapro and amlodipine.  Follow pressures.  Follow metabolic panel.       Relevant Orders   TSH   CBC with Differential/Platelet   Basic metabolic panel   Healthcare maintenance    Physical today 01/27/21.  Colonoscopy 12/2016.  Recommended f/u in 12/2021.  Check psa with next labs.       Hypercholesterolemia    On crestor.  Low cholesterol diet and exercise.  Follow lipid panel and liver function tests.        Relevant Orders   Lipid panel   Hepatic function panel   Hypertension associated with type 2 diabetes mellitus (HCC)    Low carb diet and exercise.  Follow met b and a1c.  Blood pressure well controlled.  Continue amlodipine and avapro.  Follow pressures.  Follow metabolic panel.       Relevant Orders   Hemoglobin H8N   Basic metabolic panel   Microalbumin / creatinine urine ratio   Nephrolithiasis (Chronic)    Passed a kidney stone approximately four weeks ago. Discussed further evaluation and f/u.  He declines. Wants to monitor.         Other Visit Diagnoses    Routine general medical examination at a health care facility    -  Primary   Prostate cancer screening       Relevant Orders  PSA       Einar Pheasant, MD

## 2021-01-27 NOTE — Assessment & Plan Note (Addendum)
Physical today 01/27/21.  Colonoscopy 12/2016.  Recommended f/u in 12/2021.  Check psa with next labs.

## 2021-01-27 NOTE — Progress Notes (Incomplete)
Patient ID: Jackson Cooper, male   DOB: [birth year], 55 y.o.   MRN: [id number]   Subjective:    Patient ID: Jackson Cooper, male    DOB: [birth year], 55 y.o.   MRN: [id number]  HPI This visit occurred during the SARS-CoV-2 public health emergency.  Safety protocols were in place, including screening questions prior to the visit, additional usage of staff PPE, and extensive cleaning of exam room while observing appropriate contact time as indicated for disinfecting solutions.  Patient here for his physical exam.     Past Medical History:  Diagnosis Date  . Diabetes mellitus without complication (HCC)   . DVT (deep venous thrombosis) (HCC) Diagnosed 9/08  . Encephalitis 1970  . Enlarged prostate   . Hyperglycemia   . Hyperlipidemia   . Hypertension   . Nephrolithiasis    Past Surgical History:  Procedure Laterality Date  . EYE SURGERY  1972   Family History  Problem Relation Age of Onset  . Hypertension Mother   . Breast cancer Sister    Social History   Socioeconomic History  . Marital status: Married    Spouse name: Not on file  . Number of children: Not on file  . Years of education: Not on file  . Highest education level: Not on file  Occupational History  . Not on file  Tobacco Use  . Smoking status: Never Smoker  . Smokeless tobacco: Never Used  Substance and Sexual Activity  . Alcohol use: No    Alcohol/week: 0.0 standard drinks  . Drug use: No  . Sexual activity: Not on file  Other Topics Concern  . Not on file  Social History Narrative  . Not on file   Social Determinants of Health   Financial Resource Strain: Not on file  Food Insecurity: Not on file  Transportation Needs: Not on file  Physical Activity: Not on file  Stress: Not on file  Social Connections: Not on file    Outpatient Encounter Medications as of 01/27/2021  Medication Sig  . amLODipine (NORVASC) 5 MG tablet Take 1 tablet (5 mg total) by mouth daily.  [name] aspirin 81 MG tablet Take 81 mg  by mouth daily. Take 1 tablet by mouth once a day  . B Complex Vitamins (VITAMIN B-COMPLEX PO) Take 1 tablet by mouth daily.  . cetirizine (ZYRTEC) 10 MG tablet Take 10 mg by mouth daily as needed.   . Flaxseed Oil OIL Take 1,000 mg by mouth daily. 1000 mg  . fluticasone (FLONASE) 50 MCG/ACT nasal spray Place 2 sprays into the nose daily.  . irbesartan (AVAPRO) 150 MG tablet Take 1 tablet by mouth once daily  . Krill Oil 350 MG CAPS Take 1 capsule by mouth daily.  [name] LORazepam (ATIVAN) 0.5 MG tablet Take 0.5 mg by mouth every 8 (eight) hours as needed. 1 tablet as directed  . metFORMIN (GLUCOPHAGE) 500 MG tablet Take 1 tablet (500 mg total) by mouth 2 (two) times daily with a meal.  . metFORMIN (GLUCOPHAGE-XR) 500 MG 24 hr tablet TAKE 1 TABLET BY MOUTH TWICE DAILY WITH MEALS  . rosuvastatin (CRESTOR) 5 MG tablet Take 1 tablet by mouth on Monday, Wednesday and Friday  . rosuvastatin (CRESTOR) 5 MG tablet Take 1 tablet by mouth on Monday, Wednesday and Friday.   No facility-administered encounter medications on file as of 01/27/2021.    Review of Systems     Objective:    Physical Exam  BP 110/68   Pulse  72   Temp 98.7 F (37.1 C)   Ht 5\' 9"  (1.753 m)   Wt 196 lb 2 oz (89 kg)   SpO2 96%   BMI 28.96 kg/m  Wt Readings from Last 3 Encounters:  01/27/21 196 lb 2 oz (89 kg)  09/28/20 190 lb 9.6 oz (86.5 kg)  05/24/20 201 lb (91.2 kg)     Lab Results  Component Value Date   WBC 6.0 01/29/2020   HGB 15.5 01/29/2020   HCT 45.9 01/29/2020   PLT 187.0 01/29/2020   GLUCOSE 117 (H) 10/20/2020   CHOL 145 10/20/2020   TRIG 83.0 10/20/2020   HDL 36.40 (L) 10/20/2020   LDLCALC 92 10/20/2020   ALT 15 10/20/2020   AST 13 10/20/2020   NA 138 10/20/2020   K 4.5 10/20/2020   CL 104 10/20/2020   CREATININE 1.11 10/20/2020   BUN 14 10/20/2020   CO2 28 10/20/2020   TSH 1.52 01/29/2020   PSA 1.33 01/29/2020   HGBA1C 6.6 (H) 10/20/2020   MICROALBUR <0.7 01/29/2020    03/30/2020 Venous Img  Lower Unilateral Left  Result Date: 03/15/2017 CLINICAL DATA:  55 year old male with left lower extremity pain, recent travel. Personal history of left lower extremity DVT in 2008. EXAM: LEFT LOWER EXTREMITY VENOUS DOPPLER ULTRASOUND TECHNIQUE: Gray-scale sonography with graded compression, as well as color Doppler and duplex ultrasound were performed to evaluate the lower extremity deep venous systems from the level of the common femoral vein and including the common femoral, femoral, profunda femoral, popliteal and calf veins including the posterior tibial, peroneal and gastrocnemius veins when visible. The superficial great saphenous vein was also interrogated. Spectral Doppler was utilized to evaluate flow at rest and with distal augmentation maneuvers in the common femoral, femoral and popliteal veins. COMPARISON:  04/28/2008 and earlier FINDINGS: Contralateral Common Femoral Vein: Respiratory phasicity is normal and symmetric with the symptomatic side. No evidence of thrombus. Normal compressibility. Common Femoral Vein: No evidence of thrombus. Normal compressibility, respiratory phasicity and response to augmentation. Saphenofemoral Junction: No evidence of thrombus. Normal compressibility and flow on color Doppler imaging. Profunda Femoral Vein: No evidence of thrombus. Normal compressibility and flow on color Doppler imaging. Femoral Vein: No evidence of thrombus. Normal compressibility, respiratory phasicity and response to augmentation. Popliteal Vein: No evidence of thrombus. Normal compressibility, respiratory phasicity and response to augmentation. Calf Veins: No evidence of thrombus. Normal compressibility and flow on color Doppler imaging. Superficial Great Saphenous Vein: No evidence of thrombus. Normal compressibility and flow on color Doppler imaging. Venous Reflux:  None. Other Findings:  None. IMPRESSION: No evidence of left lower extremity deep venous thrombosis. Electronically Signed   By:  06/28/2008 M.D.   On: 03/15/2017 12:13       Assessment & Plan:   Problem List Items Addressed This Visit   None      03/17/2017, MD

## 2021-01-29 ENCOUNTER — Encounter: Payer: Self-pay | Admitting: Internal Medicine

## 2021-01-29 NOTE — Assessment & Plan Note (Signed)
On crestor.  Low cholesterol diet and exercise.  Follow lipid panel and liver function tests.   

## 2021-01-29 NOTE — Assessment & Plan Note (Signed)
Continue avapro and amlodipine.  Follow pressures.  Follow metabolic panel.

## 2021-01-29 NOTE — Assessment & Plan Note (Signed)
Passed a kidney stone approximately four weeks ago. Discussed further evaluation and f/u.  He declines. Wants to monitor.

## 2021-01-29 NOTE — Assessment & Plan Note (Signed)
Low carb diet and exercise.  Follow met b and a1c.  Blood pressure well controlled.  Continue amlodipine and avapro.  Follow pressures.  Follow metabolic panel.  

## 2021-02-07 ENCOUNTER — Other Ambulatory Visit: Payer: Self-pay | Admitting: Internal Medicine

## 2021-02-08 ENCOUNTER — Other Ambulatory Visit: Payer: Self-pay | Admitting: Internal Medicine

## 2021-03-24 ENCOUNTER — Other Ambulatory Visit: Payer: Self-pay

## 2021-03-24 ENCOUNTER — Other Ambulatory Visit (INDEPENDENT_AMBULATORY_CARE_PROVIDER_SITE_OTHER): Payer: BC Managed Care – PPO

## 2021-03-24 DIAGNOSIS — E1159 Type 2 diabetes mellitus with other circulatory complications: Secondary | ICD-10-CM

## 2021-03-24 DIAGNOSIS — I152 Hypertension secondary to endocrine disorders: Secondary | ICD-10-CM | POA: Diagnosis not present

## 2021-03-24 DIAGNOSIS — Z125 Encounter for screening for malignant neoplasm of prostate: Secondary | ICD-10-CM | POA: Diagnosis not present

## 2021-03-24 DIAGNOSIS — E7800 Pure hypercholesterolemia, unspecified: Secondary | ICD-10-CM | POA: Diagnosis not present

## 2021-03-24 DIAGNOSIS — I10 Essential (primary) hypertension: Secondary | ICD-10-CM

## 2021-03-24 LAB — HEPATIC FUNCTION PANEL
ALT: 21 U/L (ref 0–53)
AST: 18 U/L (ref 0–37)
Albumin: 4.5 g/dL (ref 3.5–5.2)
Alkaline Phosphatase: 55 U/L (ref 39–117)
Bilirubin, Direct: 0.2 mg/dL (ref 0.0–0.3)
Total Bilirubin: 1 mg/dL (ref 0.2–1.2)
Total Protein: 6.7 g/dL (ref 6.0–8.3)

## 2021-03-24 LAB — CBC WITH DIFFERENTIAL/PLATELET
Basophils Absolute: 0 10*3/uL (ref 0.0–0.1)
Basophils Relative: 0.5 % (ref 0.0–3.0)
Eosinophils Absolute: 0.1 10*3/uL (ref 0.0–0.7)
Eosinophils Relative: 1.6 % (ref 0.0–5.0)
HCT: 44.1 % (ref 39.0–52.0)
Hemoglobin: 15.3 g/dL (ref 13.0–17.0)
Lymphocytes Relative: 35.1 % (ref 12.0–46.0)
Lymphs Abs: 2.1 10*3/uL (ref 0.7–4.0)
MCHC: 34.7 g/dL (ref 30.0–36.0)
MCV: 89.5 fl (ref 78.0–100.0)
Monocytes Absolute: 0.4 10*3/uL (ref 0.1–1.0)
Monocytes Relative: 7.4 % (ref 3.0–12.0)
Neutro Abs: 3.3 10*3/uL (ref 1.4–7.7)
Neutrophils Relative %: 55.4 % (ref 43.0–77.0)
Platelets: 211 10*3/uL (ref 150.0–400.0)
RBC: 4.92 Mil/uL (ref 4.22–5.81)
RDW: 12.8 % (ref 11.5–15.5)
WBC: 6 10*3/uL (ref 4.0–10.5)

## 2021-03-24 LAB — BASIC METABOLIC PANEL
BUN: 16 mg/dL (ref 6–23)
CO2: 26 mEq/L (ref 19–32)
Calcium: 9.6 mg/dL (ref 8.4–10.5)
Chloride: 104 mEq/L (ref 96–112)
Creatinine, Ser: 1.06 mg/dL (ref 0.40–1.50)
GFR: 78.39 mL/min (ref >60.00)
Glucose, Bld: 127 mg/dL — ABNORMAL HIGH (ref 70–99)
Potassium: 4.2 mEq/L (ref 3.5–5.1)
Sodium: 140 mEq/L (ref 135–145)

## 2021-03-24 LAB — MICROALBUMIN / CREATININE URINE RATIO
Creatinine,U: 67.1 mg/dL
Microalb Creat Ratio: 1 mg/g (ref 0.0–30.0)
Microalb, Ur: <0.7 mg/dL (ref 0.0–1.9)

## 2021-03-24 LAB — HEMOGLOBIN A1C: Hgb A1c MFr Bld: 7.2 % — ABNORMAL HIGH (ref 4.6–6.5)

## 2021-03-24 LAB — PSA: PSA: 1.21 ng/mL (ref 0.10–4.00)

## 2021-03-24 LAB — LIPID PANEL
Cholesterol: 146 mg/dL (ref 0–200)
HDL: 37.4 mg/dL — ABNORMAL LOW (ref >39.00)
LDL Cholesterol: 92 mg/dL (ref 0–99)
NonHDL: 108.19
Total CHOL/HDL Ratio: 4
Triglycerides: 81 mg/dL (ref 0.0–149.0)
VLDL: 16.2 mg/dL (ref 0.0–40.0)

## 2021-03-24 LAB — TSH: TSH: 1.42 u[IU]/mL (ref 0.35–4.50)

## 2021-03-28 ENCOUNTER — Other Ambulatory Visit: Payer: Self-pay

## 2021-03-28 MED ORDER — ROSUVASTATIN CALCIUM 5 MG PO TABS: ORAL_TABLET | ORAL | 3 refills | Status: DC

## 2021-03-29 NOTE — Progress Notes (Signed)
Pt scheduled  

## 2021-04-05 ENCOUNTER — Other Ambulatory Visit: Payer: Self-pay | Admitting: Internal Medicine

## 2021-04-11 ENCOUNTER — Other Ambulatory Visit: Payer: Self-pay

## 2021-04-11 ENCOUNTER — Encounter: Payer: Self-pay | Admitting: Internal Medicine

## 2021-04-11 MED ORDER — ROSUVASTATIN CALCIUM 5 MG PO TABS: ORAL_TABLET | ORAL | 1 refills | Status: DC

## 2021-06-10 ENCOUNTER — Other Ambulatory Visit: Payer: Self-pay | Admitting: Internal Medicine

## 2021-06-24 ENCOUNTER — Ambulatory Visit: Payer: BC Managed Care – PPO | Admitting: Internal Medicine

## 2021-06-24 ENCOUNTER — Other Ambulatory Visit: Payer: Self-pay

## 2021-06-24 ENCOUNTER — Encounter: Payer: Self-pay | Admitting: Internal Medicine

## 2021-06-24 DIAGNOSIS — E1159 Type 2 diabetes mellitus with other circulatory complications: Secondary | ICD-10-CM

## 2021-06-24 DIAGNOSIS — F439 Reaction to severe stress, unspecified: Secondary | ICD-10-CM | POA: Diagnosis not present

## 2021-06-24 DIAGNOSIS — I152 Hypertension secondary to endocrine disorders: Secondary | ICD-10-CM

## 2021-06-24 DIAGNOSIS — I10 Essential (primary) hypertension: Secondary | ICD-10-CM | POA: Diagnosis not present

## 2021-06-24 DIAGNOSIS — E7800 Pure hypercholesterolemia, unspecified: Secondary | ICD-10-CM

## 2021-06-24 DIAGNOSIS — L989 Disorder of the skin and subcutaneous tissue, unspecified: Secondary | ICD-10-CM

## 2021-06-24 MED ORDER — MUPIROCIN 2 % EX OINT: 1.0000 "application " | TOPICAL_OINTMENT | Freq: Two times a day (BID) | CUTANEOUS | 0 refills | Status: DC

## 2021-06-24 NOTE — Progress Notes (Signed)
Patient ID: Jackson Cooper, male   DOB: [birth year], 55 y.o.   MRN: [id number]   Subjective:    Patient ID: Jackson Cooper, male    DOB: Mar 29, 1966, 55 y.o.   MRN: [id number]  HPI This visit occurred during the SARS-CoV-2 public health emergency.  Safety protocols were in place, including screening questions prior to the visit, additional usage of staff PPE, and extensive cleaning of exam room while observing appropriate contact time as indicated for disinfecting solutions.   Patient here for work in appt. Work in for lesion - top of head.  First noticed 05/13/21.  When first noticed, just felt a bump.  One week later, dried skin.  Flipped off and noticed bleeding.  No bleeding since.  No known injury or trauma.  He did use a new razor.   Was not aware of any cuts when he used.  Persistent raised area.  Minimal tenderness to palpation.  No other lesions.  No rash. No headache. He did retire.  Is staying active.  Breathing stable.  No cough or congestion reported.  No chest pain or tightness reported.  States he is watching his diet.  Bowel stable.  Past Medical History:  Diagnosis Date   Diabetes mellitus without complication ([place])    DVT (deep venous thrombosis) ([place]) Diagnosed 9/08   Encephalitis 1970   Enlarged prostate    Hyperglycemia    Hyperlipidemia    Hypertension    Nephrolithiasis    Past Surgical History:  Procedure Laterality Date   EYE SURGERY  1972   Family History  Problem Relation Age of Onset   Hypertension Mother    Breast cancer Sister    Social History   Socioeconomic History   Marital status: Married    Spouse name: Not on file   Number of children: Not on file   Years of education: Not on file   Highest education level: Not on file  Occupational History   Not on file  Tobacco Use   Smoking status: Never   Smokeless tobacco: Never  Substance and Sexual Activity   Alcohol use: No    Alcohol/week: 0.0 standard drinks   Drug use: No   Sexual activity: Not  on file  Other Topics Concern   Not on file  Social History Narrative   Not on file   Social Determinants of Health   Financial Resource Strain: Not on file  Food Insecurity: Not on file  Transportation Needs: Not on file  Physical Activity: Not on file  Stress: Not on file  Social Connections: Not on file    Review of Systems  Constitutional:  Negative for appetite change and unexpected weight change.  HENT:  Negative for congestion and sinus pressure.   Respiratory:  Negative for cough, chest tightness and shortness of breath.   Cardiovascular:  Negative for chest pain, palpitations and leg swelling.  Gastrointestinal:  Negative for abdominal pain, diarrhea, nausea and vomiting.  Musculoskeletal:  Negative for joint swelling and myalgias.  Skin:  Negative for color change and rash.       Raised area - top of head.   Neurological:  Negative for dizziness, light-headedness and headaches.  Psychiatric/Behavioral:  Negative for agitation and dysphoric mood.       Objective:    Physical Exam Vitals reviewed.  Constitutional:      General: He is not in acute distress.    Appearance: Normal appearance. He is well-developed.  HENT:     Head:  Normocephalic and atraumatic.     Right Ear: External ear normal.     Left Ear: External ear normal.  Eyes:     General: No scleral icterus.       Right eye: No discharge.        Left eye: No discharge.     Conjunctiva/sclera: Conjunctivae normal.  Cardiovascular:     Rate and Rhythm: Normal rate and regular rhythm.  Pulmonary:     Effort: Pulmonary effort is normal. No respiratory distress.     Breath sounds: Normal breath sounds.  Abdominal:     General: Bowel sounds are normal.     Palpations: Abdomen is soft.     Tenderness: There is no abdominal tenderness.  Musculoskeletal:        General: No swelling or tenderness.     Cervical back: Neck supple. No tenderness.  Lymphadenopathy:     Cervical: No cervical adenopathy.   Skin:    Findings: No erythema or rash.     Comments: Raised (minimal crusting) - lesion - top of head.  No rash.  No surrounding erythema.  Minimal tenderness to palpation.  No lesion on foot.  Small soft tissue nodule - ball of foot.  No significant tenderness to palpation.   Neurological:     Mental Status: He is alert.  Psychiatric:        Mood and Affect: Mood normal.        Behavior: Behavior normal.    BP 120/78   Pulse 86   Temp 97.9 F (36.6 C)   Resp 16   Ht '5\' 8"'  (1.727 m)   Wt 190 lb 12.8 oz (86.5 kg)   SpO2 98%   BMI 29.01 kg/m  Wt Readings from Last 3 Encounters:  06/24/21 190 lb 12.8 oz (86.5 kg)  01/27/21 196 lb 2 oz (89 kg)  09/28/20 190 lb 9.6 oz (86.5 kg)    Outpatient Encounter Medications as of 06/24/2021  Medication Sig   mupirocin ointment (BACTROBAN) 2 % Apply 1 application topically 2 (two) times daily. For 10 days.   amLODipine (NORVASC) 5 MG tablet Take 1 tablet (5 mg total) by mouth daily.   aspirin 81 MG tablet Take 81 mg by mouth daily. Take 1 tablet by mouth once a day   B Complex Vitamins (VITAMIN B-COMPLEX PO) Take 1 tablet by mouth daily.   cetirizine (ZYRTEC) 10 MG tablet Take 10 mg by mouth daily as needed.    Flaxseed Oil OIL Take 1,000 mg by mouth daily. 1000 mg   fluticasone (FLONASE) 50 MCG/ACT nasal spray Place 2 sprays into the nose daily.   irbesartan (AVAPRO) 150 MG tablet Take 1 tablet (150 mg total) by mouth daily.   Krill Oil 350 MG CAPS Take 1 capsule by mouth daily.   LORazepam (ATIVAN) 0.5 MG tablet Take 0.5 mg by mouth every 8 (eight) hours as needed. 1 tablet as directed   metFORMIN (GLUCOPHAGE) 500 MG tablet Take 1 tablet (500 mg total) by mouth 2 (two) times daily with a meal.   rosuvastatin (CRESTOR) 5 MG tablet TAKE 1 TABLET BY MOUTH ONCE DAILY IN THE EVENING   No facility-administered encounter medications on file as of 06/24/2021.     Lab Results  Component Value Date   WBC 6.0 03/24/2021   HGB 15.3 03/24/2021    HCT 44.1 03/24/2021   PLT 211.0 03/24/2021   GLUCOSE 127 (H) 03/24/2021   CHOL 146 03/24/2021   TRIG 81.0 03/24/2021  HDL 37.40 (L) 03/24/2021   LDLCALC 92 03/24/2021   ALT 21 03/24/2021   AST 18 03/24/2021   NA 140 03/24/2021   K 4.2 03/24/2021   CL 104 03/24/2021   CREATININE 1.06 03/24/2021   BUN 16 03/24/2021   CO2 26 03/24/2021   TSH 1.42 03/24/2021   PSA 1.21 03/24/2021   HGBA1C 7.2 (H) 03/24/2021   MICROALBUR <0.7 03/24/2021    US Venous Img Lower Unilateral Left  Result Date: 03/15/2017 CLINICAL DATA:  55 year old male with left lower extremity pain, recent travel. Personal history of left lower extremity DVT in 2008. EXAM: LEFT LOWER EXTREMITY VENOUS DOPPLER ULTRASOUND TECHNIQUE: Gray-scale sonography with graded compression, as well as color Doppler and duplex ultrasound were performed to evaluate the lower extremity deep venous systems from the level of the common femoral vein and including the common femoral, femoral, profunda femoral, popliteal and calf veins including the posterior tibial, peroneal and gastrocnemius veins when visible. The superficial great saphenous vein was also interrogated. Spectral Doppler was utilized to evaluate flow at rest and with distal augmentation maneuvers in the common femoral, femoral and popliteal veins. COMPARISON:  04/28/2008 and earlier FINDINGS: Contralateral Common Femoral Vein: Respiratory phasicity is normal and symmetric with the symptomatic side. No evidence of thrombus. Normal compressibility. Common Femoral Vein: No evidence of thrombus. Normal compressibility, respiratory phasicity and response to augmentation. Saphenofemoral Junction: No evidence of thrombus. Normal compressibility and flow on color Doppler imaging. Profunda Femoral Vein: No evidence of thrombus. Normal compressibility and flow on color Doppler imaging. Femoral Vein: No evidence of thrombus. Normal compressibility, respiratory phasicity and response to augmentation.  Popliteal Vein: No evidence of thrombus. Normal compressibility, respiratory phasicity and response to augmentation. Calf Veins: No evidence of thrombus. Normal compressibility and flow on color Doppler imaging. Superficial Great Saphenous Vein: No evidence of thrombus. Normal compressibility and flow on color Doppler imaging. Venous Reflux:  None. Other Findings:  None. IMPRESSION: No evidence of left lower extremity deep venous thrombosis. Electronically Signed   By: Genevie Ann M.D.   On: 03/15/2017 12:13       Assessment & Plan:   Problem List Items Addressed This Visit     Essential hypertension, benign    Continue avapro and amlodipine.  Blood pressure as outlined.  Follow pressures. Follow metabolic panel.        Hypercholesterolemia    On crestor.   Follow lipid panel and liver function tests.         Hypertension associated with type 2 diabetes mellitus (HCC)    Low carb diet and exercise.  Follow met b and a1c.  Blood pressure well controlled.  Continue amlodipine and avapro.  Follow pressures.  Follow metabolic panel.        Skin lesion    On top of head.  No surrounding erythema.  Minimal tenderness to palpation.  bactroban ointment topically as directed.  Call with update.  Discussed may need dermatology referral.        Stress    Has retired.  Doing well.          Einar Pheasant, MD

## 2021-06-25 ENCOUNTER — Encounter: Payer: Self-pay | Admitting: Internal Medicine

## 2021-06-25 DIAGNOSIS — L989 Disorder of the skin and subcutaneous tissue, unspecified: Secondary | ICD-10-CM | POA: Insufficient documentation

## 2021-06-25 NOTE — Assessment & Plan Note (Signed)
On top of head.  No surrounding erythema.  Minimal tenderness to palpation.  bactroban ointment topically as directed.  Call with update.  Discussed may need dermatology referral.

## 2021-06-25 NOTE — Assessment & Plan Note (Signed)
Low carb diet and exercise.  Follow met b and a1c.  Blood pressure well controlled.  Continue amlodipine and avapro.  Follow pressures.  Follow metabolic panel.

## 2021-06-25 NOTE — Assessment & Plan Note (Signed)
Continue avapro and amlodipine.  Blood pressure as outlined.  Follow pressures. Follow metabolic panel.  

## 2021-06-25 NOTE — Assessment & Plan Note (Signed)
Has retired.  Doing well.

## 2021-06-25 NOTE — Assessment & Plan Note (Signed)
On crestor.  Follow lipid panel and liver function tests.   

## 2021-07-01 ENCOUNTER — Telehealth: Payer: Self-pay | Admitting: *Deleted

## 2021-07-01 DIAGNOSIS — E7800 Pure hypercholesterolemia, unspecified: Secondary | ICD-10-CM

## 2021-07-01 DIAGNOSIS — E1165 Type 2 diabetes mellitus with hyperglycemia: Secondary | ICD-10-CM

## 2021-07-01 DIAGNOSIS — I10 Essential (primary) hypertension: Secondary | ICD-10-CM

## 2021-07-01 NOTE — Telephone Encounter (Signed)
Please place future orders for lab appt.  

## 2021-07-02 DIAGNOSIS — E1165 Type 2 diabetes mellitus with hyperglycemia: Secondary | ICD-10-CM | POA: Insufficient documentation

## 2021-07-02 NOTE — Telephone Encounter (Signed)
Orders placed for labs

## 2021-07-04 ENCOUNTER — Other Ambulatory Visit: Payer: Self-pay

## 2021-07-04 ENCOUNTER — Other Ambulatory Visit (INDEPENDENT_AMBULATORY_CARE_PROVIDER_SITE_OTHER): Payer: BC Managed Care – PPO

## 2021-07-04 DIAGNOSIS — E7800 Pure hypercholesterolemia, unspecified: Secondary | ICD-10-CM | POA: Diagnosis not present

## 2021-07-04 DIAGNOSIS — E1165 Type 2 diabetes mellitus with hyperglycemia: Secondary | ICD-10-CM

## 2021-07-04 LAB — HEPATIC FUNCTION PANEL
ALT: 16 U/L (ref 0–53)
AST: 15 U/L (ref 0–37)
Albumin: 4.5 g/dL (ref 3.5–5.2)
Alkaline Phosphatase: 49 U/L (ref 39–117)
Bilirubin, Direct: 0.2 mg/dL (ref 0.0–0.3)
Total Bilirubin: 1 mg/dL (ref 0.2–1.2)
Total Protein: 6.7 g/dL (ref 6.0–8.3)

## 2021-07-04 LAB — LIPID PANEL
Cholesterol: 178 mg/dL (ref 0–200)
HDL: 39.9 mg/dL (ref >39.00)
LDL Cholesterol: 114 mg/dL — ABNORMAL HIGH (ref 0–99)
NonHDL: 138.02
Total CHOL/HDL Ratio: 4
Triglycerides: 121 mg/dL (ref 0.0–149.0)
VLDL: 24.2 mg/dL (ref 0.0–40.0)

## 2021-07-04 LAB — BASIC METABOLIC PANEL
BUN: 16 mg/dL (ref 6–23)
CO2: 27 mEq/L (ref 19–32)
Calcium: 9.4 mg/dL (ref 8.4–10.5)
Chloride: 102 mEq/L (ref 96–112)
Creatinine, Ser: 1.06 mg/dL (ref 0.40–1.50)
GFR: 78.24 mL/min (ref >60.00)
Glucose, Bld: 116 mg/dL — ABNORMAL HIGH (ref 70–99)
Potassium: 4.3 mEq/L (ref 3.5–5.1)
Sodium: 138 mEq/L (ref 135–145)

## 2021-07-04 LAB — HEMOGLOBIN A1C: Hgb A1c MFr Bld: 6.9 % — ABNORMAL HIGH (ref 4.6–6.5)

## 2021-07-06 ENCOUNTER — Encounter: Payer: Self-pay | Admitting: Internal Medicine

## 2021-07-06 ENCOUNTER — Other Ambulatory Visit: Payer: Self-pay

## 2021-07-06 ENCOUNTER — Ambulatory Visit: Payer: BC Managed Care – PPO | Admitting: Internal Medicine

## 2021-07-06 DIAGNOSIS — E1159 Type 2 diabetes mellitus with other circulatory complications: Secondary | ICD-10-CM | POA: Diagnosis not present

## 2021-07-06 DIAGNOSIS — I10 Essential (primary) hypertension: Secondary | ICD-10-CM

## 2021-07-06 DIAGNOSIS — E7800 Pure hypercholesterolemia, unspecified: Secondary | ICD-10-CM

## 2021-07-06 DIAGNOSIS — L989 Disorder of the skin and subcutaneous tissue, unspecified: Secondary | ICD-10-CM

## 2021-07-06 DIAGNOSIS — I152 Hypertension secondary to endocrine disorders: Secondary | ICD-10-CM

## 2021-07-06 DIAGNOSIS — E1165 Type 2 diabetes mellitus with hyperglycemia: Secondary | ICD-10-CM

## 2021-07-06 NOTE — Progress Notes (Signed)
Patient ID: Jackson Cooper, male   DOB: November 03, 1966, 55 y.o.   MRN: [id number]   Subjective:    Patient ID: Jackson Cooper, male    DOB: [birth year], 55 y.o.   MRN: [id number]  HPI This visit occurred during the SARS-CoV-2 public health emergency.  Safety protocols were in place, including screening questions prior to the visit, additional usage of staff PPE, and extensive cleaning of exam room while observing appropriate contact time as indicated for disinfecting solutions.   Patient here for a scheduled follow up.  Here to follow up regarding his diabetes, blood pressure and cholesterol.  Recently evaluated for scalp lesion.  Using bactroban.  Has improved some, but persistent.  No pain.  Trying to stay active.  No chest pain or sob reported.  No abdominal pain.  Bowels moving.  Discussed labs.  A1c 6.9.    Past Medical History:  Diagnosis Date   Diabetes mellitus without complication ([place])    DVT (deep venous thrombosis) ([place]) Diagnosed 9/08   Encephalitis 1970   Enlarged prostate    Hyperglycemia    Hyperlipidemia    Hypertension    Nephrolithiasis    Past Surgical History:  Procedure Laterality Date   EYE SURGERY  1972   Family History  Problem Relation Age of Onset   Hypertension Mother    Breast cancer Sister    Social History   Socioeconomic History   Marital status: Married    Spouse name: Not on file   Number of children: Not on file   Years of education: Not on file   Highest education level: Not on file  Occupational History   Not on file  Tobacco Use   Smoking status: Never   Smokeless tobacco: Never  Substance and Sexual Activity   Alcohol use: No    Alcohol/week: 0.0 standard drinks   Drug use: No   Sexual activity: Not on file  Other Topics Concern   Not on file  Social History Narrative   Not on file   Social Determinants of Health   Financial Resource Strain: Not on file  Food Insecurity: Not on file  Transportation Needs: Not on file  Physical  Activity: Not on file  Stress: Not on file  Social Connections: Not on file    Review of Systems  Constitutional:  Negative for appetite change and unexpected weight change.  HENT:  Negative for congestion and sinus pressure.   Respiratory:  Negative for cough, chest tightness and shortness of breath.   Cardiovascular:  Negative for chest pain, palpitations and leg swelling.  Gastrointestinal:  Negative for abdominal pain, diarrhea, nausea and vomiting.  Genitourinary:  Negative for difficulty urinating and dysuria.  Musculoskeletal:  Negative for joint swelling and myalgias.  Skin:  Negative for color change and rash.  Neurological:  Negative for dizziness, light-headedness and headaches.  Psychiatric/Behavioral:  Negative for agitation and dysphoric mood.       Objective:    Physical Exam Constitutional:      General: He is not in acute distress.    Appearance: Normal appearance. He is well-developed.  HENT:     Head: Normocephalic and atraumatic.     Right Ear: External ear normal.     Left Ear: External ear normal.  Eyes:     General: No scleral icterus.       Right eye: No discharge.        Left eye: No discharge.  Cardiovascular:     Rate and  Rhythm: Normal rate and regular rhythm.  Pulmonary:     Effort: Pulmonary effort is normal. No respiratory distress.     Breath sounds: Normal breath sounds.  Abdominal:     General: Bowel sounds are normal.     Palpations: Abdomen is soft.     Tenderness: There is no abdominal tenderness.  Musculoskeletal:        General: No swelling or tenderness.     Cervical back: Neck supple. No tenderness.  Lymphadenopathy:     Cervical: No cervical adenopathy.  Skin:    Findings: No erythema or rash.  Neurological:     Mental Status: He is alert.  Psychiatric:        Mood and Affect: Mood normal.        Behavior: Behavior normal.    BP 112/70   Pulse 60   Temp 98 F (36.7 C)   Resp 16   Ht '5\' 8"'  (1.727 m)   Wt 190 lb 12.8  oz (86.5 kg)   SpO2 98%   BMI 29.01 kg/m  Wt Readings from Last 3 Encounters:  07/06/21 190 lb 12.8 oz (86.5 kg)  06/24/21 190 lb 12.8 oz (86.5 kg)  01/27/21 196 lb 2 oz (89 kg)    Outpatient Encounter Medications as of 07/06/2021  Medication Sig   amLODipine (NORVASC) 5 MG tablet Take 1 tablet (5 mg total) by mouth daily.   aspirin 81 MG tablet Take 81 mg by mouth daily. Take 1 tablet by mouth once a day   B Complex Vitamins (VITAMIN B-COMPLEX PO) Take 1 tablet by mouth daily.   cetirizine (ZYRTEC) 10 MG tablet Take 10 mg by mouth daily as needed.    Flaxseed Oil OIL Take 1,000 mg by mouth daily. 1000 mg   fluticasone (FLONASE) 50 MCG/ACT nasal spray Place 2 sprays into the nose daily.   irbesartan (AVAPRO) 150 MG tablet Take 1 tablet (150 mg total) by mouth daily.   Krill Oil 350 MG CAPS Take 1 capsule by mouth daily.   LORazepam (ATIVAN) 0.5 MG tablet Take 0.5 mg by mouth every 8 (eight) hours as needed. 1 tablet as directed   metFORMIN (GLUCOPHAGE) 500 MG tablet Take 1 tablet (500 mg total) by mouth 2 (two) times daily with a meal.   mupirocin ointment (BACTROBAN) 2 % Apply 1 application topically 2 (two) times daily. For 10 days.   rosuvastatin (CRESTOR) 5 MG tablet TAKE 1 TABLET BY MOUTH ONCE DAILY IN THE EVENING   No facility-administered encounter medications on file as of 07/06/2021.     Lab Results  Component Value Date   WBC 6.0 03/24/2021   HGB 15.3 03/24/2021   HCT 44.1 03/24/2021   PLT 211.0 03/24/2021   GLUCOSE 116 (H) 07/04/2021   CHOL 178 07/04/2021   TRIG 121.0 07/04/2021   HDL 39.90 07/04/2021   LDLCALC 114 (H) 07/04/2021   ALT 16 07/04/2021   AST 15 07/04/2021   NA 138 07/04/2021   K 4.3 07/04/2021   CL 102 07/04/2021   CREATININE 1.06 07/04/2021   BUN 16 07/04/2021   CO2 27 07/04/2021   TSH 1.42 03/24/2021   PSA 1.21 03/24/2021   HGBA1C 6.9 (H) 07/04/2021   MICROALBUR <0.7 03/24/2021    US Venous Img Lower Unilateral Left  Result Date:  03/15/2017 CLINICAL DATA:  55 year old male with left lower extremity pain, recent travel. Personal history of left lower extremity DVT in 2008. EXAM: LEFT LOWER EXTREMITY VENOUS DOPPLER ULTRASOUND TECHNIQUE:  Gray-scale sonography with graded compression, as well as color Doppler and duplex ultrasound were performed to evaluate the lower extremity deep venous systems from the level of the common femoral vein and including the common femoral, femoral, profunda femoral, popliteal and calf veins including the posterior tibial, peroneal and gastrocnemius veins when visible. The superficial great saphenous vein was also interrogated. Spectral Doppler was utilized to evaluate flow at rest and with distal augmentation maneuvers in the common femoral, femoral and popliteal veins. COMPARISON:  04/28/2008 and earlier FINDINGS: Contralateral Common Femoral Vein: Respiratory phasicity is normal and symmetric with the symptomatic side. No evidence of thrombus. Normal compressibility. Common Femoral Vein: No evidence of thrombus. Normal compressibility, respiratory phasicity and response to augmentation. Saphenofemoral Junction: No evidence of thrombus. Normal compressibility and flow on color Doppler imaging. Profunda Femoral Vein: No evidence of thrombus. Normal compressibility and flow on color Doppler imaging. Femoral Vein: No evidence of thrombus. Normal compressibility, respiratory phasicity and response to augmentation. Popliteal Vein: No evidence of thrombus. Normal compressibility, respiratory phasicity and response to augmentation. Calf Veins: No evidence of thrombus. Normal compressibility and flow on color Doppler imaging. Superficial Great Saphenous Vein: No evidence of thrombus. Normal compressibility and flow on color Doppler imaging. Venous Reflux:  None. Other Findings:  None. IMPRESSION: No evidence of left lower extremity deep venous thrombosis. Electronically Signed   By: Genevie Ann M.D.   On: 03/15/2017 12:13        Assessment & Plan:   Problem List Items Addressed This Visit     Essential hypertension, benign    Continue avapro and amlodipine.  Blood pressure as outlined.  Follow pressures. Follow metabolic panel.       Hypercholesterolemia    On crestor.   Follow lipid panel and liver function tests.        Relevant Orders   Hepatic function panel   Lipid panel   Hypertension associated with type 2 diabetes mellitus (HCC)    Continue avapro and amlodipine.  Blood pressure as outlined.  Follow pressures. Follow metabolic panel.       Relevant Orders   Basic metabolic panel   Scalp lesion    Persistent.  No pain.  Refer to dermatology.       Relevant Orders   Ambulatory referral to Dermatology   Type 2 diabetes mellitus with hyperglycemia ([place])    Low carb diet and exercise.  Continue on metformin.  Follow met b and a1c.       Relevant Orders   Hemoglobin A1c     Einar Pheasant, MD

## 2021-07-11 ENCOUNTER — Encounter: Payer: Self-pay | Admitting: Internal Medicine

## 2021-07-11 DIAGNOSIS — L989 Disorder of the skin and subcutaneous tissue, unspecified: Secondary | ICD-10-CM | POA: Insufficient documentation

## 2021-07-11 NOTE — Assessment & Plan Note (Signed)
Low carb diet and exercise.  Continue on metformin.  Follow met b and a1c.

## 2021-07-11 NOTE — Assessment & Plan Note (Signed)
Persistent.  No pain.  Refer to dermatology.

## 2021-07-11 NOTE — Assessment & Plan Note (Signed)
Continue avapro and amlodipine.  Blood pressure as outlined.  Follow pressures. Follow metabolic panel.  

## 2021-07-11 NOTE — Assessment & Plan Note (Signed)
On crestor.  Follow lipid panel and liver function tests.   

## 2021-07-18 ENCOUNTER — Encounter: Payer: Self-pay | Admitting: Internal Medicine

## 2021-07-18 NOTE — Telephone Encounter (Signed)
The are in question first noticed 05/14/21, first was just flaky skin, had hit head month earlier and thought was a scab then , ,said its just not going away.  No pain, no bleeding on its on. Uses the cream Bactroban with Band-Aid twice daily looks better but not going away.

## 2021-07-18 NOTE — Telephone Encounter (Signed)
Lanson sent this update.  The lesion is not going away.  Can we get an earlier appt with the dermatologist.  See pictures - attached.

## 2021-07-19 NOTE — Telephone Encounter (Signed)
Please notify Adren of attached information.

## 2021-07-20 NOTE — Telephone Encounter (Signed)
Patient has appt with [facility] derm in September. Advised that he should be ok to wait until then. He says he is ok to wait if you are ok with this, but if you feel this should be taken care of more urgently he is agreeable to see someone else. I dont know that he would be able to get in with another dermatology office sooner than 07/2021

## 2021-07-20 NOTE — Telephone Encounter (Signed)
Patient aware of below.

## 2021-07-20 NOTE — Telephone Encounter (Signed)
Jackson Cooper is asking if he could get in with another dermatologist any earlier.  Do you need me to place another order for a referral locally to see if we can get an earlier appt. Thank you for your help.

## 2021-07-22 NOTE — Telephone Encounter (Signed)
Thank you :)

## 2021-07-25 NOTE — Telephone Encounter (Signed)
Pt got a call from dermatology and they had a cancellation for tomorrow afternoon 07/26/21. He would like to have LORazepam (ATIVAN) 0.5 MG tablet sent in for his anxiety prior to possible procedure with needles or anything. Please advise asap

## 2021-07-26 MED ORDER — LORAZEPAM 0.5 MG PO TABS: ORAL_TABLET | ORAL | 0 refills | Status: DC

## 2021-07-26 NOTE — Telephone Encounter (Signed)
Are you okay to send this in for him? He is seeing dermatology today for the place on his scalp.

## 2021-07-26 NOTE — Telephone Encounter (Signed)
Patient is aware 

## 2021-07-26 NOTE — Telephone Encounter (Signed)
Rx sent in for lorazepam.  I sent in several tablets in case they have to do more than one procedure.  Would recommend him have someone drive him.  Take 1/2 prior to procedure. May repeat x 1 if needed.

## 2021-07-29 ENCOUNTER — Other Ambulatory Visit: Payer: Self-pay | Admitting: Internal Medicine

## 2021-08-02 ENCOUNTER — Other Ambulatory Visit: Payer: Self-pay | Admitting: Internal Medicine

## 2021-08-07 ENCOUNTER — Encounter: Payer: Self-pay | Admitting: Internal Medicine

## 2021-08-09 ENCOUNTER — Other Ambulatory Visit: Payer: Self-pay

## 2021-08-09 MED ORDER — METFORMIN HCL 500 MG PO TABS: 500.0000 mg | ORAL_TABLET | Freq: Two times a day (BID) | ORAL | 1 refills | Status: DC

## 2021-08-15 ENCOUNTER — Other Ambulatory Visit: Payer: Self-pay | Admitting: Internal Medicine

## 2021-08-29 ENCOUNTER — Other Ambulatory Visit: Payer: Self-pay | Admitting: Internal Medicine

## 2021-09-06 ENCOUNTER — Encounter: Payer: Self-pay | Admitting: Internal Medicine

## 2021-09-15 ENCOUNTER — Other Ambulatory Visit: Payer: Self-pay | Admitting: Internal Medicine

## 2021-10-03 ENCOUNTER — Other Ambulatory Visit: Payer: Self-pay | Admitting: Internal Medicine

## 2021-11-03 ENCOUNTER — Other Ambulatory Visit: Payer: Self-pay | Admitting: Internal Medicine

## 2021-11-04 ENCOUNTER — Telehealth: Payer: Self-pay | Admitting: Internal Medicine

## 2021-11-04 MED ORDER — METFORMIN HCL ER 500 MG PO TB24: 500.0000 mg | ORAL_TABLET | Freq: Two times a day (BID) | ORAL | 1 refills | Status: DC

## 2021-11-04 NOTE — Telephone Encounter (Signed)
Ok to change prescription to metformin XR 500mg  bid.  (Per med list, he is currently taking metformin 500mg  bid) .  Let know if persistent problems.

## 2021-11-04 NOTE — Telephone Encounter (Signed)
Patient unhappy with regular metformin stated pharmacy using new supplier he is wanting the extended release back  I have pasted the first message from 09/06/21 below now wife sending message in her chart  I am forward ing to you.     8:17 PM The most recent refill is "Pill with imprint G 10 is [place] and has been identified as Metformin Hydrochloride 500 mg. It is supplied by [employer]." This is a different supplier than before and I've noticed a difference since taking this prescription. Mostly constipation. Is it possible that medication from a different supplier can cause new side effects? There's a lot left to take since having this filled but I wish I could replace it. At least have a say in whether I get this particular metformin the next time around. Thanks. Jolene Schimke

## 2021-11-07 NOTE — Telephone Encounter (Signed)
Medication sent in 12/9

## 2021-11-08 ENCOUNTER — Other Ambulatory Visit: Payer: BC Managed Care – PPO

## 2021-11-10 ENCOUNTER — Ambulatory Visit: Payer: BC Managed Care – PPO | Admitting: Internal Medicine

## 2021-11-20 ENCOUNTER — Other Ambulatory Visit: Payer: Self-pay | Admitting: Internal Medicine

## 2021-11-30 ENCOUNTER — Other Ambulatory Visit: Payer: BC Managed Care – PPO

## 2021-12-02 ENCOUNTER — Other Ambulatory Visit: Payer: BC Managed Care – PPO

## 2021-12-02 ENCOUNTER — Ambulatory Visit: Payer: BC Managed Care – PPO | Admitting: Internal Medicine

## 2021-12-03 ENCOUNTER — Other Ambulatory Visit: Payer: Self-pay | Admitting: Internal Medicine

## 2021-12-12 ENCOUNTER — Other Ambulatory Visit (INDEPENDENT_AMBULATORY_CARE_PROVIDER_SITE_OTHER): Payer: BC Managed Care – PPO

## 2021-12-12 ENCOUNTER — Other Ambulatory Visit: Payer: Self-pay

## 2021-12-12 DIAGNOSIS — E1165 Type 2 diabetes mellitus with hyperglycemia: Secondary | ICD-10-CM | POA: Diagnosis not present

## 2021-12-12 DIAGNOSIS — E7800 Pure hypercholesterolemia, unspecified: Secondary | ICD-10-CM | POA: Diagnosis not present

## 2021-12-12 DIAGNOSIS — I152 Hypertension secondary to endocrine disorders: Secondary | ICD-10-CM

## 2021-12-12 DIAGNOSIS — E1159 Type 2 diabetes mellitus with other circulatory complications: Secondary | ICD-10-CM | POA: Diagnosis not present

## 2021-12-12 LAB — HEPATIC FUNCTION PANEL
ALT: 24 U/L (ref 0–53)
AST: 16 U/L (ref 0–37)
Albumin: 4.4 g/dL (ref 3.5–5.2)
Alkaline Phosphatase: 55 U/L (ref 39–117)
Bilirubin, Direct: 0.1 mg/dL (ref 0.0–0.3)
Total Bilirubin: 0.6 mg/dL (ref 0.2–1.2)
Total Protein: 6.7 g/dL (ref 6.0–8.3)

## 2021-12-12 LAB — LIPID PANEL
Cholesterol: 147 mg/dL (ref 0–200)
HDL: 33.4 mg/dL — ABNORMAL LOW (ref >39.00)
LDL Cholesterol: 91 mg/dL (ref 0–99)
NonHDL: 114.08
Total CHOL/HDL Ratio: 4
Triglycerides: 113 mg/dL (ref 0.0–149.0)
VLDL: 22.6 mg/dL (ref 0.0–40.0)

## 2021-12-12 LAB — BASIC METABOLIC PANEL
BUN: 17 mg/dL (ref 6–23)
CO2: 26 mEq/L (ref 19–32)
Calcium: 9.2 mg/dL (ref 8.4–10.5)
Chloride: 102 mEq/L (ref 96–112)
Creatinine, Ser: 1.03 mg/dL (ref 0.40–1.50)
GFR: 80.73 mL/min (ref >60.00)
Glucose, Bld: 158 mg/dL — ABNORMAL HIGH (ref 70–99)
Potassium: 4.4 mEq/L (ref 3.5–5.1)
Sodium: 139 mEq/L (ref 135–145)

## 2021-12-12 LAB — HEMOGLOBIN A1C: Hgb A1c MFr Bld: 7.4 % — ABNORMAL HIGH (ref 4.6–6.5)

## 2021-12-13 ENCOUNTER — Other Ambulatory Visit: Payer: Self-pay | Admitting: Internal Medicine

## 2022-01-05 ENCOUNTER — Telehealth: Payer: Self-pay

## 2022-01-05 NOTE — Telephone Encounter (Signed)
LMTCB to schedule an appointment. Dr. Arline Asp has given okay to schedule patient on a Monday in SAME DAY appointment slot.

## 2022-02-17 ENCOUNTER — Other Ambulatory Visit: Payer: Self-pay | Admitting: Internal Medicine

## 2022-04-14 ENCOUNTER — Encounter: Payer: Self-pay | Admitting: Internal Medicine

## 2022-04-14 NOTE — Telephone Encounter (Signed)
If already using saline and flonase, probably needs to be evaluated to determine treatment needed.  See if any openings at our office.  If on, see if can make appt across the street - for evaluation and then we can f/u.

## 2022-05-12 ENCOUNTER — Other Ambulatory Visit: Payer: Self-pay | Admitting: Internal Medicine

## 2022-05-18 ENCOUNTER — Other Ambulatory Visit: Payer: Self-pay | Admitting: Internal Medicine

## 2022-05-23 ENCOUNTER — Ambulatory Visit: Payer: BC Managed Care – PPO | Admitting: Internal Medicine

## 2022-06-06 ENCOUNTER — Encounter: Payer: Self-pay | Admitting: Internal Medicine

## 2022-06-06 ENCOUNTER — Ambulatory Visit: Payer: BC Managed Care – PPO | Admitting: Internal Medicine

## 2022-06-06 VITALS — BP 116/78 | HR 72 | Temp 98.3°F | Resp 17 | Ht 68.0 in | Wt 188.6 lb

## 2022-06-06 DIAGNOSIS — E1165 Type 2 diabetes mellitus with hyperglycemia: Secondary | ICD-10-CM

## 2022-06-06 DIAGNOSIS — Z125 Encounter for screening for malignant neoplasm of prostate: Secondary | ICD-10-CM

## 2022-06-06 DIAGNOSIS — I152 Hypertension secondary to endocrine disorders: Secondary | ICD-10-CM

## 2022-06-06 DIAGNOSIS — Z1211 Encounter for screening for malignant neoplasm of colon: Secondary | ICD-10-CM

## 2022-06-06 DIAGNOSIS — M25512 Pain in left shoulder: Secondary | ICD-10-CM

## 2022-06-06 DIAGNOSIS — I10 Essential (primary) hypertension: Secondary | ICD-10-CM | POA: Diagnosis not present

## 2022-06-06 DIAGNOSIS — E7800 Pure hypercholesterolemia, unspecified: Secondary | ICD-10-CM

## 2022-06-06 DIAGNOSIS — E1159 Type 2 diabetes mellitus with other circulatory complications: Secondary | ICD-10-CM

## 2022-06-06 DIAGNOSIS — L989 Disorder of the skin and subcutaneous tissue, unspecified: Secondary | ICD-10-CM

## 2022-06-06 DIAGNOSIS — N200 Calculus of kidney: Secondary | ICD-10-CM

## 2022-06-06 DIAGNOSIS — F439 Reaction to severe stress, unspecified: Secondary | ICD-10-CM

## 2022-06-06 LAB — HM DIABETES FOOT EXAM

## 2022-06-06 NOTE — Progress Notes (Unsigned)
Patient ID: Jackson Cooper, male   DOB: 1966/08/05, 56 y.o.   MRN: [id number]   Subjective:    Patient ID: Jackson Cooper, male    DOB: 1966-01-04, 56 y.o.   MRN: [id number]   Patient here for a scheduled follow up.   Chief Complaint  Patient presents with   Hypertension   Diabetes   .   HPI He is not checking his blood sugars.  Watches his diet some.  Not real strict.  Discussed diet and exercise.  Is s/p lithotripsy (Dr Yves Dill).  No pain now.  No blood in urine.  No chest pain or sob reported.  No cough or congestion.  No acid reflux.  No abdominal pain.  Bowels moving.  No significant headache or vision changes.  Persistent foot lesion.  Has appt with podiatry scheduled.  Has recently changed jobs.  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  Discussed due colonoscopy.  Noticed recently - popping - left shoulder.  Some better.  No significant pain currently.  Good rom.    Past Medical History:  Diagnosis Date   Diabetes mellitus without complication ([place])    DVT (deep venous thrombosis) ([place]) Diagnosed 9/08   Encephalitis 1970   Enlarged prostate    Hyperglycemia    Hyperlipidemia    Hypertension    Nephrolithiasis    Past Surgical History:  Procedure Laterality Date   EYE SURGERY  1972   Family History  Problem Relation Age of Onset   Hypertension Mother    Breast cancer Sister    Social History   Socioeconomic History   Marital status: Married    Spouse name: Not on file   Number of children: Not on file   Years of education: Not on file   Highest education level: Not on file  Occupational History   Not on file  Tobacco Use   Smoking status: Never   Smokeless tobacco: Never  Substance and Sexual Activity   Alcohol use: No    Alcohol/week: 0.0 standard drinks of alcohol   Drug use: No   Sexual activity: Not on file  Other Topics Concern   Not on file  Social History Narrative   Not on file   Social Determinants of Health   Financial  Resource Strain: Not on file  Food Insecurity: Not on file  Transportation Needs: Not on file  Physical Activity: Not on file  Stress: Not on file  Social Connections: Not on file     Review of Systems  Constitutional:  Negative for appetite change and unexpected weight change.  HENT:  Negative for congestion and sinus pressure.   Respiratory:  Negative for cough, chest tightness and shortness of breath.   Cardiovascular:  Negative for chest pain, palpitations and leg swelling.  Gastrointestinal:  Negative for abdominal pain, diarrhea, nausea and vomiting.  Genitourinary:  Negative for difficulty urinating and dysuria.  Musculoskeletal:  Negative for joint swelling and myalgias.       Shoulder pain as outlined.   Skin:  Negative for color change and rash.  Neurological:  Negative for dizziness, light-headedness and headaches.  Psychiatric/Behavioral:  Negative for agitation and dysphoric mood.        Objective:     BP 116/78 (BP Location: Left Arm, Patient Position: Sitting, Cuff Size: Small)   Pulse 72   Temp 98.3 F (36.8 C) (Temporal)   Resp 17   Ht '5\' 8"'  (1.727 m)   Wt 188  lb 9.6 oz (85.5 kg)   SpO2 99%   BMI 28.68 kg/m  Wt Readings from Last 3 Encounters:  06/06/22 188 lb 9.6 oz (85.5 kg)  07/06/21 190 lb 12.8 oz (86.5 kg)  06/24/21 190 lb 12.8 oz (86.5 kg)    Physical Exam Constitutional:      General: He is not in acute distress.    Appearance: Normal appearance. He is well-developed.  HENT:     Head: Normocephalic and atraumatic.     Right Ear: External ear normal.     Left Ear: External ear normal.  Eyes:     General: No scleral icterus.       Right eye: No discharge.        Left eye: No discharge.  Cardiovascular:     Rate and Rhythm: Normal rate and regular rhythm.  Pulmonary:     Effort: Pulmonary effort is normal. No respiratory distress.     Breath sounds: Normal breath sounds.  Abdominal:     General: Bowel sounds are normal.      Palpations: Abdomen is soft.     Tenderness: There is no abdominal tenderness.  Musculoskeletal:        General: No swelling or tenderness.     Cervical back: Neck supple. No tenderness.  Lymphadenopathy:     Cervical: No cervical adenopathy.  Skin:    Findings: No erythema or rash.  Neurological:     Mental Status: He is alert.  Psychiatric:        Mood and Affect: Mood normal.        Behavior: Behavior normal.      Outpatient Encounter Medications as of 06/06/2022  Medication Sig   amLODipine (NORVASC) 5 MG tablet Take 1 tablet by mouth once daily   aspirin 81 MG tablet Take 81 mg by mouth daily. Take 1 tablet by mouth once a day   B Complex Vitamins (VITAMIN B-COMPLEX PO) Take 1 tablet by mouth daily.   cetirizine (ZYRTEC) 10 MG tablet Take 10 mg by mouth daily as needed.    Flaxseed Oil OIL Take 1,000 mg by mouth daily. 1000 mg   fluticasone (FLONASE) 50 MCG/ACT nasal spray Place 2 sprays into the nose daily.   irbesartan (AVAPRO) 150 MG tablet Take 1 tablet by mouth once daily   Krill Oil 350 MG CAPS Take 1 capsule by mouth daily.   LORazepam (ATIVAN) 0.5 MG tablet Take 1/2 tablet prior to procedure.  May repeat x 1 if needed.   metFORMIN (GLUCOPHAGE-XR) 500 MG 24 hr tablet Take 1 tablet by mouth twice daily   rosuvastatin (CRESTOR) 5 MG tablet Take 1 tablet by mouth in the evening   [DISCONTINUED] mupirocin ointment (BACTROBAN) 2 % Apply 1 application topically 2 (two) times daily. For 10 days.   No facility-administered encounter medications on file as of 06/06/2022.     Lab Results  Component Value Date   WBC 6.0 03/24/2021   HGB 15.3 03/24/2021   HCT 44.1 03/24/2021   PLT 211.0 03/24/2021   GLUCOSE 158 (H) 12/12/2021   CHOL 147 12/12/2021   TRIG 113.0 12/12/2021   HDL 33.40 (L) 12/12/2021   LDLCALC 91 12/12/2021   ALT 24 12/12/2021   AST 16 12/12/2021   NA 139 12/12/2021   K 4.4 12/12/2021   CL 102 12/12/2021   CREATININE 1.03 12/12/2021   BUN 17 12/12/2021    CO2 26 12/12/2021   TSH 1.42 03/24/2021   PSA 1.21 03/24/2021  HGBA1C 7.4 (H) 12/12/2021   MICROALBUR <0.7 03/24/2021    US Venous Img Lower Unilateral Left  Result Date: 03/15/2017 CLINICAL DATA:  56 year old male with left lower extremity pain, recent travel. Personal history of left lower extremity DVT in 2008. EXAM: LEFT LOWER EXTREMITY VENOUS DOPPLER ULTRASOUND TECHNIQUE: Gray-scale sonography with graded compression, as well as color Doppler and duplex ultrasound were performed to evaluate the lower extremity deep venous systems from the level of the common femoral vein and including the common femoral, femoral, profunda femoral, popliteal and calf veins including the posterior tibial, peroneal and gastrocnemius veins when visible. The superficial great saphenous vein was also interrogated. Spectral Doppler was utilized to evaluate flow at rest and with distal augmentation maneuvers in the common femoral, femoral and popliteal veins. COMPARISON:  04/28/2008 and earlier FINDINGS: Contralateral Common Femoral Vein: Respiratory phasicity is normal and symmetric with the symptomatic side. No evidence of thrombus. Normal compressibility. Common Femoral Vein: No evidence of thrombus. Normal compressibility, respiratory phasicity and response to augmentation. Saphenofemoral Junction: No evidence of thrombus. Normal compressibility and flow on color Doppler imaging. Profunda Femoral Vein: No evidence of thrombus. Normal compressibility and flow on color Doppler imaging. Femoral Vein: No evidence of thrombus. Normal compressibility, respiratory phasicity and response to augmentation. Popliteal Vein: No evidence of thrombus. Normal compressibility, respiratory phasicity and response to augmentation. Calf Veins: No evidence of thrombus. Normal compressibility and flow on color Doppler imaging. Superficial Great Saphenous Vein: No evidence of thrombus. Normal compressibility and flow on color Doppler imaging.  Venous Reflux:  None. Other Findings:  None. IMPRESSION: No evidence of left lower extremity deep venous thrombosis. Electronically Signed   By: Genevie Ann M.D.   On: 03/15/2017 12:13       Assessment & Plan:   Problem List Items Addressed This Visit     Colon cancer screening    Due colonoscopy.  Previously saw Dr Tiffany Kocher.  Agreeable to referral.       Relevant Orders   Ambulatory referral to Gastroenterology   Essential hypertension, benign    Continue avapro and amlodipine.  Blood pressure as outlined.  Follow pressures. Follow metabolic panel.       Hypercholesterolemia    On crestor.   Follow lipid panel and liver function tests.        Relevant Orders   Hepatic function panel   Lipid Profile   TSH   CBC with Differential/Platelet   Hypertension associated with type 2 diabetes mellitus ([place]) - Primary   Relevant Orders   Basic Metabolic Panel (BMET)   Scalp lesion    Saw dermatology ([facility]) [facility] dermatology - Dr Campbell Lerner.       Shoulder pain, left    Left shoulder pain as outlined.  Not constant.  Is better.  Notify me if persistent.       Skin lesion    Skin lesion of foot.  Has appt with podiatry [facility]) scheduled.       Stress    Planning to start working as [employer].  Overall appears to be handling things relatively well.  Follow.       Type 2 diabetes mellitus with hyperglycemia (HCC)    Low carb diet and exercise.  Continue on metformin.  Follow met b and a1c.       Relevant Orders   Urine Microalbumin w/creat. ratio   HgB A1c   Nephrolithiasis (Chronic)    S/p recent lithotripsy.  No current symptoms.  No  hematuria now.  Seeing urology.  Recheck urine - per pt - revealed no blood.       Other Visit Diagnoses     Prostate cancer screening       Relevant Orders   PSA        Einar Pheasant, MD

## 2022-06-07 ENCOUNTER — Other Ambulatory Visit: Payer: Self-pay | Admitting: Internal Medicine

## 2022-06-07 ENCOUNTER — Encounter: Payer: Self-pay | Admitting: Internal Medicine

## 2022-06-07 ENCOUNTER — Other Ambulatory Visit (INDEPENDENT_AMBULATORY_CARE_PROVIDER_SITE_OTHER): Payer: BC Managed Care – PPO

## 2022-06-07 DIAGNOSIS — E1165 Type 2 diabetes mellitus with hyperglycemia: Secondary | ICD-10-CM

## 2022-06-07 DIAGNOSIS — I152 Hypertension secondary to endocrine disorders: Secondary | ICD-10-CM

## 2022-06-07 DIAGNOSIS — E1159 Type 2 diabetes mellitus with other circulatory complications: Secondary | ICD-10-CM | POA: Diagnosis not present

## 2022-06-07 DIAGNOSIS — Z1211 Encounter for screening for malignant neoplasm of colon: Secondary | ICD-10-CM | POA: Insufficient documentation

## 2022-06-07 DIAGNOSIS — E7800 Pure hypercholesterolemia, unspecified: Secondary | ICD-10-CM | POA: Diagnosis not present

## 2022-06-07 DIAGNOSIS — M25512 Pain in left shoulder: Secondary | ICD-10-CM | POA: Insufficient documentation

## 2022-06-07 DIAGNOSIS — Z125 Encounter for screening for malignant neoplasm of prostate: Secondary | ICD-10-CM

## 2022-06-07 LAB — CBC WITH DIFFERENTIAL/PLATELET
Basophils Absolute: 0 10*3/uL (ref 0.0–0.1)
Basophils Relative: 0.5 % (ref 0.0–3.0)
Eosinophils Absolute: 0.1 10*3/uL (ref 0.0–0.7)
Eosinophils Relative: 2.3 % (ref 0.0–5.0)
HCT: 42.7 % (ref 39.0–52.0)
Hemoglobin: 14.4 g/dL (ref 13.0–17.0)
Lymphocytes Relative: 33.9 % (ref 12.0–46.0)
Lymphs Abs: 2.1 10*3/uL (ref 0.7–4.0)
MCHC: 33.7 g/dL (ref 30.0–36.0)
MCV: 90.5 fl (ref 78.0–100.0)
Monocytes Absolute: 0.4 10*3/uL (ref 0.1–1.0)
Monocytes Relative: 6.4 % (ref 3.0–12.0)
Neutro Abs: 3.6 10*3/uL (ref 1.4–7.7)
Neutrophils Relative %: 56.9 % (ref 43.0–77.0)
Platelets: 186 10*3/uL (ref 150.0–400.0)
RBC: 4.72 Mil/uL (ref 4.22–5.81)
RDW: 13.8 % (ref 11.5–15.5)
WBC: 6.3 10*3/uL (ref 4.0–10.5)

## 2022-06-07 LAB — BASIC METABOLIC PANEL
BUN: 17 mg/dL (ref 6–23)
CO2: 28 mEq/L (ref 19–32)
Calcium: 9.1 mg/dL (ref 8.4–10.5)
Chloride: 104 mEq/L (ref 96–112)
Creatinine, Ser: 1.06 mg/dL (ref 0.40–1.50)
GFR: 77.73 mL/min (ref >60.00)
Glucose, Bld: 114 mg/dL — ABNORMAL HIGH (ref 70–99)
Potassium: 4.2 mEq/L (ref 3.5–5.1)
Sodium: 137 mEq/L (ref 135–145)

## 2022-06-07 LAB — LIPID PANEL
Cholesterol: 133 mg/dL (ref 0–200)
HDL: 38.5 mg/dL — ABNORMAL LOW (ref >39.00)
LDL Cholesterol: 70 mg/dL (ref 0–99)
NonHDL: 94.57
Total CHOL/HDL Ratio: 3
Triglycerides: 124 mg/dL (ref 0.0–149.0)
VLDL: 24.8 mg/dL (ref 0.0–40.0)

## 2022-06-07 LAB — MICROALBUMIN / CREATININE URINE RATIO
Creatinine,U: 40.6 mg/dL
Microalb Creat Ratio: 1.7 mg/g (ref 0.0–30.0)
Microalb, Ur: <0.7 mg/dL (ref 0.0–1.9)

## 2022-06-07 LAB — HEPATIC FUNCTION PANEL
ALT: 13 U/L (ref 0–53)
AST: 14 U/L (ref 0–37)
Albumin: 4.4 g/dL (ref 3.5–5.2)
Alkaline Phosphatase: 50 U/L (ref 39–117)
Bilirubin, Direct: 0.1 mg/dL (ref 0.0–0.3)
Total Bilirubin: 0.6 mg/dL (ref 0.2–1.2)
Total Protein: 6.3 g/dL (ref 6.0–8.3)

## 2022-06-07 LAB — HEMOGLOBIN A1C: Hgb A1c MFr Bld: 6.9 % — ABNORMAL HIGH (ref 4.6–6.5)

## 2022-06-07 LAB — TSH: TSH: 1.3 u[IU]/mL (ref 0.35–5.50)

## 2022-06-07 LAB — PSA: PSA: 1.37 ng/mL (ref 0.10–4.00)

## 2022-06-07 NOTE — Assessment & Plan Note (Signed)
Continue avapro and amlodipine.  Blood pressure as outlined.  Follow pressures. Follow metabolic panel.  

## 2022-06-07 NOTE — Assessment & Plan Note (Signed)
S/p recent lithotripsy.  No current symptoms.  No hematuria now.  Seeing urology.  Recheck urine - per pt - revealed no blood.

## 2022-06-07 NOTE — Assessment & Plan Note (Signed)
Low carb diet and exercise.  Continue on metformin.  Follow met b and a1c.

## 2022-06-07 NOTE — Assessment & Plan Note (Signed)
Due colonoscopy.  Previously saw Dr Markham Jordan.  Agreeable to referral.

## 2022-06-07 NOTE — Assessment & Plan Note (Signed)
On crestor.  Follow lipid panel and liver function tests.   

## 2022-06-07 NOTE — Assessment & Plan Note (Signed)
Skin lesion of foot.  Has appt with podiatry [facility]) scheduled.

## 2022-06-07 NOTE — Assessment & Plan Note (Signed)
Planning to start working as [employer].  Overall appears to be handling things relatively well.  Follow.

## 2022-06-07 NOTE — Assessment & Plan Note (Signed)
Saw dermatology ([facility]) [facility] dermatology - Dr Rory Percy.

## 2022-06-07 NOTE — Assessment & Plan Note (Signed)
Left shoulder pain as outlined.  Not constant.  Is better.  Notify me if persistent.

## 2022-06-08 ENCOUNTER — Telehealth: Payer: Self-pay

## 2022-06-08 NOTE — Telephone Encounter (Signed)
LMTCB for results. 

## 2022-06-09 ENCOUNTER — Other Ambulatory Visit: Payer: Self-pay | Admitting: Internal Medicine

## 2022-06-09 ENCOUNTER — Other Ambulatory Visit: Payer: Self-pay

## 2022-06-09 NOTE — Telephone Encounter (Signed)
Patient returned office phone call for results 

## 2022-06-09 NOTE — Telephone Encounter (Signed)
Patient stated he is at work and will not be able to answer phone. Front office read Dr [employer] message about labs. Patient understood and had no other questions. He stated no need to call him.

## 2022-06-12 ENCOUNTER — Ambulatory Visit: Payer: BC Managed Care – PPO | Admitting: Internal Medicine

## 2022-06-19 ENCOUNTER — Encounter: Payer: Self-pay | Admitting: Internal Medicine

## 2022-06-22 ENCOUNTER — Encounter: Payer: Self-pay | Admitting: Internal Medicine

## 2022-06-22 ENCOUNTER — Ambulatory Visit (INDEPENDENT_AMBULATORY_CARE_PROVIDER_SITE_OTHER): Payer: BC Managed Care – PPO | Admitting: Internal Medicine

## 2022-06-22 ENCOUNTER — Ambulatory Visit
Admission: RE | Admit: 2022-06-22 | Discharge: 2022-06-22 | Disposition: A | Discharge status: Home / Self Care | Payer: BC Managed Care – PPO | Source: Ambulatory Visit | Attending: Internal Medicine | Admitting: Internal Medicine

## 2022-06-22 VITALS — BP 124/70 | HR 73 | Temp 98.0°F | Resp 73 | Ht 68.0 in | Wt 190.0 lb

## 2022-06-22 DIAGNOSIS — R1013 Epigastric pain: Secondary | ICD-10-CM | POA: Diagnosis not present

## 2022-06-22 DIAGNOSIS — R1011 Right upper quadrant pain: Secondary | ICD-10-CM

## 2022-06-22 DIAGNOSIS — I10 Essential (primary) hypertension: Secondary | ICD-10-CM

## 2022-06-22 DIAGNOSIS — Z1211 Encounter for screening for malignant neoplasm of colon: Secondary | ICD-10-CM

## 2022-06-22 DIAGNOSIS — E1165 Type 2 diabetes mellitus with hyperglycemia: Secondary | ICD-10-CM | POA: Diagnosis not present

## 2022-06-22 DIAGNOSIS — R109 Unspecified abdominal pain: Secondary | ICD-10-CM | POA: Insufficient documentation

## 2022-06-22 LAB — CBC WITH DIFFERENTIAL/PLATELET
Basophils Absolute: 0 10*3/uL (ref 0.0–0.1)
Basophils Relative: 0.8 % (ref 0.0–3.0)
Eosinophils Absolute: 0.2 10*3/uL (ref 0.0–0.7)
Eosinophils Relative: 3.2 % (ref 0.0–5.0)
HCT: 43.2 % (ref 39.0–52.0)
Hemoglobin: 14.8 g/dL (ref 13.0–17.0)
Lymphocytes Relative: 26.7 % (ref 12.0–46.0)
Lymphs Abs: 1.7 10*3/uL (ref 0.7–4.0)
MCHC: 34.2 g/dL (ref 30.0–36.0)
MCV: 89.8 fl (ref 78.0–100.0)
Monocytes Absolute: 0.4 10*3/uL (ref 0.1–1.0)
Monocytes Relative: 6.3 % (ref 3.0–12.0)
Neutro Abs: 3.9 10*3/uL (ref 1.4–7.7)
Neutrophils Relative %: 63 % (ref 43.0–77.0)
Platelets: 181 10*3/uL (ref 150.0–400.0)
RBC: 4.81 Mil/uL (ref 4.22–5.81)
RDW: 13.6 % (ref 11.5–15.5)
WBC: 6.2 10*3/uL (ref 4.0–10.5)

## 2022-06-22 LAB — BASIC METABOLIC PANEL
BUN: 13 mg/dL (ref 6–23)
CO2: 25 mEq/L (ref 19–32)
Calcium: 9.3 mg/dL (ref 8.4–10.5)
Chloride: 105 mEq/L (ref 96–112)
Creatinine, Ser: 1.01 mg/dL (ref 0.40–1.50)
GFR: 82.35 mL/min (ref >60.00)
Glucose, Bld: 177 mg/dL — ABNORMAL HIGH (ref 70–99)
Potassium: 3.9 mEq/L (ref 3.5–5.1)
Sodium: 141 mEq/L (ref 135–145)

## 2022-06-22 LAB — HEPATIC FUNCTION PANEL
ALT: 16 U/L (ref 0–53)
AST: 15 U/L (ref 0–37)
Albumin: 4.4 g/dL (ref 3.5–5.2)
Alkaline Phosphatase: 50 U/L (ref 39–117)
Bilirubin, Direct: 0.1 mg/dL (ref 0.0–0.3)
Total Bilirubin: 0.8 mg/dL (ref 0.2–1.2)
Total Protein: 6.5 g/dL (ref 6.0–8.3)

## 2022-06-22 LAB — AMYLASE: Amylase: 39 U/L (ref 27–131)

## 2022-06-22 LAB — LIPASE: Lipase: 41 U/L (ref 11.0–59.0)

## 2022-06-22 NOTE — Assessment & Plan Note (Addendum)
Two weeks - epigastric and RUQ pain. Pain through to back.  No vomiting.  Concern regarding possible cholecystitis, gastritis, PUD, pancreatitis - discussed.   Obtain labs, inclucing cbc, met b and liver tests.  Check lipase.  Check an abdominal ultrasound.  Start pepcid.  Further w/up and treatment pending results.

## 2022-06-22 NOTE — Patient Instructions (Signed)
Out patient imaging center - 3:00 today  Start pepcid (famotidine) 20mg  - take one per day.   Do not eat or drink anything else prior to ultrasound.

## 2022-06-22 NOTE — Assessment & Plan Note (Signed)
Continue avapro and amlodipine.  Blood pressure as outlined.  Follow pressures. Follow metabolic panel.

## 2022-06-22 NOTE — Progress Notes (Signed)
Patient ID: Jackson Cooper, male   DOB: May 15, 1966, 57 y.o.   MRN: [id number]   Subjective:    Patient ID: Jackson Cooper, male    DOB: Mar 07, 1966, 56 y.o.   MRN: [id number]   Patient here for work in appt.   Chief Complaint  Patient presents with   Abdominal Pain   .   Abdominal Pain Associated symptoms include nausea. Pertinent negatives include no dysuria, fever, headaches, myalgias or vomiting.   Work in with concerns regarding RUQ pain and epigastric pain.  He is accompanied by his wife.  History obtained from both of them. Describes a "queezy" feeling intermittently.  Started two weeks ago - after eating a cheeseburger.  Has continued to have intermittent pain and queezy feeling.  No vomiting.  Increased pain - epigastric region that radiates through to his back.  Bowels are moving.  Not as often, but goes qod.  No diarrhea and no significant constipation.  Describes the pain as intermittent, sharp.  Increased gas.  Eating can make it some better at times.  No blood in stool.    Past Medical History:  Diagnosis Date   Diabetes mellitus without complication ([place])    DVT (deep venous thrombosis) ([place]) Diagnosed 9/08   Encephalitis 1970   Enlarged prostate    Hyperglycemia    Hyperlipidemia    Hypertension    Nephrolithiasis    Past Surgical History:  Procedure Laterality Date   EYE SURGERY  1972   Family History  Problem Relation Age of Onset   Hypertension Mother    Breast cancer Sister    Social History   Socioeconomic History   Marital status: Married    Spouse name: Not on file   Number of children: Not on file   Years of education: Not on file   Highest education level: Not on file  Occupational History   Not on file  Tobacco Use   Smoking status: Never   Smokeless tobacco: Never  Substance and Sexual Activity   Alcohol use: No    Alcohol/week: 0.0 standard drinks of alcohol   Drug use: No   Sexual activity: Not on file  Other Topics Concern   Not on  file  Social History Narrative   Not on file   Social Determinants of Health   Financial Resource Strain: Not on file  Food Insecurity: Not on file  Transportation Needs: Not on file  Physical Activity: Not on file  Stress: Not on file  Social Connections: Not on file     Review of Systems  Constitutional:  Negative for appetite change, fever and unexpected weight change.  HENT:  Negative for congestion and sinus pressure.   Respiratory:  Negative for cough, chest tightness and shortness of breath.   Cardiovascular:  Negative for chest pain, palpitations and leg swelling.  Gastrointestinal:  Positive for abdominal pain and nausea. Negative for vomiting.  Genitourinary:  Negative for difficulty urinating and dysuria.  Musculoskeletal:  Negative for joint swelling and myalgias.  Skin:  Negative for color change and rash.  Neurological:  Negative for dizziness, light-headedness and headaches.  Psychiatric/Behavioral:  Negative for agitation and dysphoric mood.        Objective:     BP 124/70 (BP Location: Left Arm, Patient Position: Sitting, Cuff Size: Large)   Pulse 73   Temp 98 F (36.7 C) (Temporal)   Resp (!) 73   Ht '5\' 8"'  (1.727 m)   Wt 190 lb (86.2 kg)  SpO2 98%   BMI 28.89 kg/m  Wt Readings from Last 3 Encounters:  06/22/22 190 lb (86.2 kg)  06/06/22 188 lb 9.6 oz (85.5 kg)  07/06/21 190 lb 12.8 oz (86.5 kg)    Physical Exam Constitutional:      General: He is not in acute distress.    Appearance: Normal appearance. He is well-developed.  HENT:     Head: Normocephalic and atraumatic.     Right Ear: External ear normal.     Left Ear: External ear normal.  Eyes:     General: No scleral icterus.       Right eye: No discharge.        Left eye: No discharge.  Cardiovascular:     Rate and Rhythm: Normal rate and regular rhythm.  Pulmonary:     Effort: Pulmonary effort is normal. No respiratory distress.     Breath sounds: Normal breath sounds.   Abdominal:     General: Bowel sounds are normal.     Palpations: Abdomen is soft.     Comments: Increased tenderness RUQ and epigastric region.  No rebound or guarding.   Musculoskeletal:        General: No swelling or tenderness.     Cervical back: Neck supple. No tenderness.  Lymphadenopathy:     Cervical: No cervical adenopathy.  Skin:    Findings: No erythema or rash.  Neurological:     Mental Status: He is alert.  Psychiatric:        Mood and Affect: Mood normal.        Behavior: Behavior normal.      Outpatient Encounter Medications as of 06/22/2022  Medication Sig   amLODipine (NORVASC) 5 MG tablet Take 1 tablet by mouth once daily   aspirin 81 MG tablet Take 81 mg by mouth daily. Take 1 tablet by mouth once a day   B Complex Vitamins (VITAMIN B-COMPLEX PO) Take 1 tablet by mouth daily.   cetirizine (ZYRTEC) 10 MG tablet Take 10 mg by mouth daily as needed.    Flaxseed Oil OIL Take 1,000 mg by mouth daily. 1000 mg   fluticasone (FLONASE) 50 MCG/ACT nasal spray Place 2 sprays into the nose daily.   irbesartan (AVAPRO) 150 MG tablet Take 1 tablet by mouth once daily   Krill Oil 350 MG CAPS Take 1 capsule by mouth daily.   LORazepam (ATIVAN) 0.5 MG tablet Take 1/2 tablet prior to procedure.  May repeat x 1 if needed.   metFORMIN (GLUCOPHAGE-XR) 500 MG 24 hr tablet Take 1 tablet by mouth twice daily   rosuvastatin (CRESTOR) 5 MG tablet Take 1 tablet by mouth in the evening   No facility-administered encounter medications on file as of 06/22/2022.     Lab Results  Component Value Date   WBC 6.2 06/22/2022   HGB 14.8 06/22/2022   HCT 43.2 06/22/2022   PLT 181.0 06/22/2022   GLUCOSE 177 (H) 06/22/2022   CHOL 133 06/07/2022   TRIG 124.0 06/07/2022   HDL 38.50 (L) 06/07/2022   LDLCALC 70 06/07/2022   ALT 16 06/22/2022   AST 15 06/22/2022   NA 141 06/22/2022   K 3.9 06/22/2022   CL 105 06/22/2022   CREATININE 1.01 06/22/2022   BUN 13 06/22/2022   CO2 25 06/22/2022    TSH 1.30 06/07/2022   PSA 1.37 06/07/2022   HGBA1C 6.9 (H) 06/07/2022   MICROALBUR <0.7 06/07/2022    US Venous Img Lower Unilateral Left  Result  Date: 03/15/2017 CLINICAL DATA:  56 year old male with left lower extremity pain, recent travel. Personal history of left lower extremity DVT in 2008. EXAM: LEFT LOWER EXTREMITY VENOUS DOPPLER ULTRASOUND TECHNIQUE: Gray-scale sonography with graded compression, as well as color Doppler and duplex ultrasound were performed to evaluate the lower extremity deep venous systems from the level of the common femoral vein and including the common femoral, femoral, profunda femoral, popliteal and calf veins including the posterior tibial, peroneal and gastrocnemius veins when visible. The superficial great saphenous vein was also interrogated. Spectral Doppler was utilized to evaluate flow at rest and with distal augmentation maneuvers in the common femoral, femoral and popliteal veins. COMPARISON:  04/28/2008 and earlier FINDINGS: Contralateral Common Femoral Vein: Respiratory phasicity is normal and symmetric with the symptomatic side. No evidence of thrombus. Normal compressibility. Common Femoral Vein: No evidence of thrombus. Normal compressibility, respiratory phasicity and response to augmentation. Saphenofemoral Junction: No evidence of thrombus. Normal compressibility and flow on color Doppler imaging. Profunda Femoral Vein: No evidence of thrombus. Normal compressibility and flow on color Doppler imaging. Femoral Vein: No evidence of thrombus. Normal compressibility, respiratory phasicity and response to augmentation. Popliteal Vein: No evidence of thrombus. Normal compressibility, respiratory phasicity and response to augmentation. Calf Veins: No evidence of thrombus. Normal compressibility and flow on color Doppler imaging. Superficial Great Saphenous Vein: No evidence of thrombus. Normal compressibility and flow on color Doppler imaging. Venous Reflux:   None. Other Findings:  None. IMPRESSION: No evidence of left lower extremity deep venous thrombosis. Electronically Signed   By: Genevie Ann M.D.   On: 03/15/2017 12:13       Assessment & Plan:   Problem List Items Addressed This Visit     Abdominal pain    Two weeks - epigastric and RUQ pain. Pain through to back.  No vomiting.  Concern regarding possible cholecystitis, gastritis, PUD, pancreatitis - discussed.   Obtain labs, inclucing cbc, met b and liver tests.  Check lipase.  Check an abdominal ultrasound.  Start pepcid.  Further w/up and treatment pending results.       Relevant Orders   US Abdomen Complete (Completed)   CBC with Differential/Platelet (Completed)   Hepatic function panel (Completed)   Amylase (Completed)   Lipase (Completed)   Basic metabolic panel (Completed)   Colon cancer screening    Due.  Has been referred.       Essential hypertension, benign    Continue avapro and amlodipine.  Blood pressure as outlined.  Follow pressures. Follow metabolic panel.       Type 2 diabetes mellitus with hyperglycemia (H[facility])    Low carb diet and exercise.  Continue on metformin.  Follow met b and a1c.       Other Visit Diagnoses     Right upper quadrant pain    -  Primary   Relevant Orders   US Abdomen Complete (Completed)   CBC with Differential/Platelet (Completed)   Hepatic function panel (Completed)   Amylase (Completed)   Lipase (Completed)   Basic metabolic panel (Completed)        Einar Pheasant, MD

## 2022-06-22 NOTE — Assessment & Plan Note (Signed)
Low carb diet and exercise.  Continue on metformin.  Follow met b and a1c.

## 2022-06-23 ENCOUNTER — Encounter: Payer: Self-pay | Admitting: Internal Medicine

## 2022-06-23 NOTE — Assessment & Plan Note (Signed)
Due.  Has been referred.

## 2022-06-27 ENCOUNTER — Telehealth: Payer: Self-pay

## 2022-06-27 NOTE — Telephone Encounter (Signed)
LMTCB to confirm patient doing okay.

## 2022-06-28 NOTE — Telephone Encounter (Signed)
Patient returned Jackson Cooper, CMA's call.  Patient states the pain has gotten some better.  Patient states it seems like the medication is working ok.  Patient states he has an appointment scheduled to see Dr. Mia Creek from gastroenterology on 10/18/2022 at 9am.

## 2022-06-28 NOTE — Telephone Encounter (Signed)
If persistent pain, I would like to schedule a f/u appt with me.  Also, I can see about getting him an earlier appt with GI if desires.

## 2022-07-05 NOTE — Telephone Encounter (Signed)
Lm for pt to cb.

## 2022-07-11 ENCOUNTER — Telehealth: Payer: Self-pay

## 2022-07-11 NOTE — Telephone Encounter (Signed)
I would continue the pepcid for now.  Recommend continuing increased fluid intake.  Also, miralax daily to help with bowel movements.

## 2022-07-11 NOTE — Telephone Encounter (Signed)
Lm to advise pt of below. Continue pepcid , Increase fluids, try miralax.

## 2022-07-11 NOTE — Telephone Encounter (Signed)
Pt advised.

## 2022-07-11 NOTE — Telephone Encounter (Signed)
Pt returning call

## 2022-08-10 ENCOUNTER — Telehealth: Payer: Self-pay | Admitting: Internal Medicine

## 2022-08-10 NOTE — Telephone Encounter (Signed)
Pt spouse called stating she would like to get a referral for a colonoscopy before the pt see the GI

## 2022-08-11 NOTE — Telephone Encounter (Signed)
I have placed an order for GI referral.  Have they heard anything from GI office.

## 2022-08-11 NOTE — Telephone Encounter (Signed)
Patient states he is returning our call.  I let patient know that Dr. Dale Tatum has sent a referral to Dr. Eather Colas at [facility] and gave him their phone number.

## 2022-08-17 ENCOUNTER — Other Ambulatory Visit: Payer: Self-pay | Admitting: Family

## 2022-08-22 ENCOUNTER — Other Ambulatory Visit: Payer: Self-pay | Admitting: Family

## 2022-08-25 ENCOUNTER — Other Ambulatory Visit: Payer: Self-pay | Admitting: Internal Medicine

## 2022-08-25 MED ORDER — ROSUVASTATIN CALCIUM 5 MG PO TABS: 5.0000 mg | ORAL_TABLET | Freq: Every evening | ORAL | 0 refills | Status: DC

## 2022-08-25 MED ORDER — AMLODIPINE BESYLATE 5 MG PO TABS: 5.0000 mg | ORAL_TABLET | Freq: Every day | ORAL | 0 refills | Status: DC

## 2022-08-25 MED ORDER — METFORMIN HCL ER 500 MG PO TB24: 500.0000 mg | ORAL_TABLET | Freq: Two times a day (BID) | ORAL | 0 refills | Status: DC

## 2022-08-25 MED ORDER — IRBESARTAN 150 MG PO TABS: 150.0000 mg | ORAL_TABLET | Freq: Every day | ORAL | 0 refills | Status: DC

## 2022-10-09 ENCOUNTER — Encounter: Payer: BC Managed Care – PPO | Admitting: Internal Medicine

## 2022-11-21 ENCOUNTER — Other Ambulatory Visit: Payer: Self-pay | Admitting: Internal Medicine

## 2022-12-05 ENCOUNTER — Other Ambulatory Visit: Payer: Self-pay | Admitting: Internal Medicine

## 2023-02-16 ENCOUNTER — Other Ambulatory Visit: Payer: Self-pay | Admitting: Internal Medicine

## 2023-03-22 ENCOUNTER — Telehealth: Payer: Self-pay | Admitting: Internal Medicine

## 2023-03-22 ENCOUNTER — Encounter: Payer: Self-pay | Admitting: Internal Medicine

## 2023-03-22 DIAGNOSIS — I10 Essential (primary) hypertension: Secondary | ICD-10-CM

## 2023-03-22 DIAGNOSIS — E7800 Pure hypercholesterolemia, unspecified: Secondary | ICD-10-CM

## 2023-03-22 DIAGNOSIS — E1165 Type 2 diabetes mellitus with hyperglycemia: Secondary | ICD-10-CM

## 2023-03-22 DIAGNOSIS — K2970 Gastritis, unspecified, without bleeding: Secondary | ICD-10-CM | POA: Insufficient documentation

## 2023-03-22 NOTE — Telephone Encounter (Signed)
Pt would like lab work done before his cpe

## 2023-03-22 NOTE — Telephone Encounter (Signed)
Labs ordered.

## 2023-04-16 ENCOUNTER — Other Ambulatory Visit (INDEPENDENT_AMBULATORY_CARE_PROVIDER_SITE_OTHER): Payer: BC Managed Care – PPO

## 2023-04-16 DIAGNOSIS — Z7984 Long term (current) use of oral hypoglycemic drugs: Secondary | ICD-10-CM | POA: Diagnosis not present

## 2023-04-16 DIAGNOSIS — I10 Essential (primary) hypertension: Secondary | ICD-10-CM | POA: Diagnosis not present

## 2023-04-16 DIAGNOSIS — E7800 Pure hypercholesterolemia, unspecified: Secondary | ICD-10-CM

## 2023-04-16 DIAGNOSIS — E1165 Type 2 diabetes mellitus with hyperglycemia: Secondary | ICD-10-CM

## 2023-04-16 LAB — LIPID PANEL
Cholesterol: 123 mg/dL (ref 0–200)
HDL: 37.9 mg/dL — ABNORMAL LOW (ref >39.00)
LDL Cholesterol: 72 mg/dL (ref 0–99)
NonHDL: 85.34
Total CHOL/HDL Ratio: 3
Triglycerides: 68 mg/dL (ref 0.0–149.0)
VLDL: 13.6 mg/dL (ref 0.0–40.0)

## 2023-04-16 LAB — HEPATIC FUNCTION PANEL
ALT: 15 U/L (ref 0–53)
AST: 14 U/L (ref 0–37)
Albumin: 4.3 g/dL (ref 3.5–5.2)
Alkaline Phosphatase: 57 U/L (ref 39–117)
Bilirubin, Direct: 0.1 mg/dL (ref 0.0–0.3)
Total Bilirubin: 0.5 mg/dL (ref 0.2–1.2)
Total Protein: 6.4 g/dL (ref 6.0–8.3)

## 2023-04-16 LAB — BASIC METABOLIC PANEL
BUN: 15 mg/dL (ref 6–23)
CO2: 27 mEq/L (ref 19–32)
Calcium: 9.4 mg/dL (ref 8.4–10.5)
Chloride: 104 mEq/L (ref 96–112)
Creatinine, Ser: 1.09 mg/dL (ref 0.40–1.50)
GFR: 74.72 mL/min (ref >60.00)
Glucose, Bld: 141 mg/dL — ABNORMAL HIGH (ref 70–99)
Potassium: 4.3 mEq/L (ref 3.5–5.1)
Sodium: 141 mEq/L (ref 135–145)

## 2023-04-16 LAB — HEMOGLOBIN A1C: Hgb A1c MFr Bld: 7.4 % — ABNORMAL HIGH (ref 4.6–6.5)

## 2023-04-18 ENCOUNTER — Telehealth: Payer: Self-pay

## 2023-04-18 DIAGNOSIS — Z125 Encounter for screening for malignant neoplasm of prostate: Secondary | ICD-10-CM

## 2023-04-18 NOTE — Telephone Encounter (Signed)
-----   Message from Dale [place], MD sent at 04/17/2023  5:28 AM EDT ----- Notify - A1c increased form last check.  Confirm taking metformin as directed.  If he can get some sugar readings prior to his upcoming appt.  Plan to discussed medication adjustment. Cholesterol levels ok.  Good cholesterol slightly decreased.  Exercise will increase good cholesterol. Liver function tests wnl.

## 2023-04-19 NOTE — Telephone Encounter (Signed)
Pt returned [place] LPN call. Note below was read to pt. Pt aware and understood.  Pt also stated that he PSA was not drawn. He was wondering if an order can be put in?? He was looking for that result. Any questions, or concern he's [phone]

## 2023-04-20 NOTE — Telephone Encounter (Signed)
Pt gave a verbal understanding and stated that he will schedule the lab appt at his next appt. Lab order has been placed.

## 2023-04-20 NOTE — Telephone Encounter (Signed)
Last PSA was in 06/08/2022.

## 2023-04-20 NOTE — Telephone Encounter (Signed)
Notify him that psa was 05/2012.  Can put order in and have drawn, but needs to be one year from last.  Does he have a f/u appt with me.  If so, can schedule at his appt

## 2023-04-20 NOTE — Addendum Note (Signed)
Addended by: Sandy Salaam on: 04/20/2023 02:59 PM   Modules accepted: Orders

## 2023-04-24 ENCOUNTER — Other Ambulatory Visit: Payer: BC Managed Care – PPO

## 2023-04-30 ENCOUNTER — Ambulatory Visit (INDEPENDENT_AMBULATORY_CARE_PROVIDER_SITE_OTHER): Payer: BC Managed Care – PPO | Admitting: Internal Medicine

## 2023-04-30 VITALS — BP 114/68 | HR 67 | Temp 97.9°F | Resp 16 | Ht 68.0 in | Wt 188.0 lb

## 2023-04-30 DIAGNOSIS — Z7984 Long term (current) use of oral hypoglycemic drugs: Secondary | ICD-10-CM

## 2023-04-30 DIAGNOSIS — I10 Essential (primary) hypertension: Secondary | ICD-10-CM | POA: Diagnosis not present

## 2023-04-30 DIAGNOSIS — Z1211 Encounter for screening for malignant neoplasm of colon: Secondary | ICD-10-CM

## 2023-04-30 DIAGNOSIS — Z0000 Encounter for general adult medical examination without abnormal findings: Secondary | ICD-10-CM | POA: Diagnosis not present

## 2023-04-30 DIAGNOSIS — E1165 Type 2 diabetes mellitus with hyperglycemia: Secondary | ICD-10-CM

## 2023-04-30 DIAGNOSIS — I152 Hypertension secondary to endocrine disorders: Secondary | ICD-10-CM

## 2023-04-30 DIAGNOSIS — E7800 Pure hypercholesterolemia, unspecified: Secondary | ICD-10-CM

## 2023-04-30 DIAGNOSIS — F439 Reaction to severe stress, unspecified: Secondary | ICD-10-CM

## 2023-04-30 DIAGNOSIS — E1159 Type 2 diabetes mellitus with other circulatory complications: Secondary | ICD-10-CM | POA: Diagnosis not present

## 2023-04-30 MED ORDER — AMLODIPINE BESYLATE 5 MG PO TABS: 5.0000 mg | ORAL_TABLET | Freq: Every day | ORAL | 1 refills | Status: DC

## 2023-04-30 MED ORDER — IRBESARTAN 150 MG PO TABS: 150.0000 mg | ORAL_TABLET | Freq: Every day | ORAL | 0 refills | Status: DC

## 2023-04-30 NOTE — Assessment & Plan Note (Signed)
Physical today 04/30/23.  Colonoscopy 12/2022 - One 7 mm polyp in the ascending colon, one 3 mm polyp in the transverse colon, one 5 mm polyp in the rectum, one diminutive polyp in the rectum, mild diverticulosis in the sigmoid colon and in the transverse colon. Recommend f/u colonoscopy in 7 years. Check psa with next labs.

## 2023-04-30 NOTE — Progress Notes (Signed)
Subjective:    Patient ID: Jackson Cooper, male    DOB: [birth year], 57 y.o.   MRN: [id number]  Patient here for  Chief Complaint  Patient presents with   Medical Management of Chronic Issues    HPI Here for physical exam. He is accompanied by his wife.  History obtained from both of them. Increased stress recently with family issues.  Discussed.  Overall appears to be handling things relatively well.  Staying active.  No chest pain or sob reported.  No cough or congestion.  No abdominal pain or cramping.  Bowels stable. Reviewed recent sugars - much improved 112-126.  Discussed previous labs.  A1c 7.4. on metformin. Just saw GI for f/u GERD 03/22/23.  Recommended continuing prilosec 20mg  bid.  Recommended 7 year f/u colonoscopy - colonoscopy 12/2022 - one 7mm polyp (ascending), 3mm polyp transverse colon.     Past Medical History:  Diagnosis Date   Diabetes mellitus without complication ([facility])    DVT (deep venous thrombosis) ([facility]) Diagnosed 9/08   Encephalitis 1970   Enlarged prostate    Hyperglycemia    Hyperlipidemia    Hypertension    Nephrolithiasis    Past Surgical History:  Procedure Laterality Date   EYE SURGERY  1972   Family History  Problem Relation Age of Onset   Hypertension Mother    Breast cancer Sister    Social History   Socioeconomic History   Marital status: Married    Spouse name: Not on file   Number of children: Not on file   Years of education: Not on file   Highest education level: Not on file  Occupational History   Not on file  Tobacco Use   Smoking status: Never   Smokeless tobacco: Never  Substance and Sexual Activity   Alcohol use: No    Alcohol/week: 0.0 standard drinks of alcohol   Drug use: No   Sexual activity: Not on file  Other Topics Concern   Not on file  Social History Narrative   Not on file   Social Determinants of Health   Financial Resource Strain: Not on file  Food Insecurity: Not on file  Transportation Needs: Not  on file  Physical Activity: Not on file  Stress: Not on file  Social Connections: Not on file     Review of Systems  Constitutional:  Negative for appetite change and unexpected weight change.  HENT:  Negative for congestion, sinus pressure and sore throat.   Eyes:  Negative for pain and visual disturbance.  Respiratory:  Negative for cough, chest tightness and shortness of breath.   Cardiovascular:  Negative for chest pain, palpitations and leg swelling.  Gastrointestinal:  Negative for abdominal pain, diarrhea, nausea and vomiting.  Genitourinary:  Negative for difficulty urinating and dysuria.  Musculoskeletal:  Negative for joint swelling and myalgias.  Skin:  Negative for color change and rash.  Neurological:  Negative for dizziness and headaches.  Hematological:  Negative for adenopathy. Does not bruise/bleed easily.  Psychiatric/Behavioral:  Negative for agitation and dysphoric mood.        Objective:     BP 114/68   Pulse 67   Temp 97.9 F (36.6 C)   Resp 16   Ht 5\' 8"  (1.727 m)   Wt 188 lb (85.3 kg)   SpO2 98%   BMI 28.59 kg/m  Wt Readings from Last 3 Encounters:  04/30/23 188 lb (85.3 kg)  06/22/22 190 lb (86.2 kg)  06/06/22 188 lb 9.6  oz (85.5 kg)    Physical Exam Constitutional:      General: He is not in acute distress.    Appearance: Normal appearance. He is well-developed.  HENT:     Head: Normocephalic and atraumatic.     Right Ear: External ear normal.     Left Ear: External ear normal.  Eyes:     General: No scleral icterus.       Right eye: No discharge.        Left eye: No discharge.     Conjunctiva/sclera: Conjunctivae normal.  Neck:     Thyroid: No thyromegaly.  Cardiovascular:     Rate and Rhythm: Normal rate and regular rhythm.  Pulmonary:     Effort: No respiratory distress.     Breath sounds: Normal breath sounds. No wheezing.  Abdominal:     General: Bowel sounds are normal.     Palpations: Abdomen is soft.     Tenderness:  There is no abdominal tenderness.  Musculoskeletal:        General: No swelling or tenderness.     Cervical back: Neck supple. No tenderness.  Lymphadenopathy:     Cervical: No cervical adenopathy.  Skin:    Findings: No erythema or rash.  Neurological:     Mental Status: He is alert and oriented to person, place, and time.  Psychiatric:        Mood and Affect: Mood normal.        Behavior: Behavior normal.      Outpatient Encounter Medications as of 04/30/2023  Medication Sig   amLODipine (NORVASC) 5 MG tablet Take 1 tablet (5 mg total) by mouth daily.   B Complex Vitamins (VITAMIN B-COMPLEX PO) Take 1 tablet by mouth daily.   Flaxseed Oil OIL Take 1,000 mg by mouth daily. 1000 mg   fluticasone (FLONASE) 50 MCG/ACT nasal spray Place 2 sprays into the nose daily.   irbesartan (AVAPRO) 150 MG tablet Take 1 tablet (150 mg total) by mouth daily.   Krill Oil 350 MG CAPS Take 1 capsule by mouth daily.   LORazepam (ATIVAN) 0.5 MG tablet Take 1/2 tablet prior to procedure.  May repeat x 1 if needed.   metFORMIN (GLUCOPHAGE-XR) 500 MG 24 hr tablet Take 1 tablet by mouth twice daily   rosuvastatin (CRESTOR) 5 MG tablet TAKE 1 TABLET BY MOUTH ONCE DAILY IN THE EVENING   [DISCONTINUED] amLODipine (NORVASC) 5 MG tablet Take 1 tablet by mouth once daily   [DISCONTINUED] aspirin 81 MG tablet Take 81 mg by mouth daily. Take 1 tablet by mouth once a day   [DISCONTINUED] cetirizine (ZYRTEC) 10 MG tablet Take 10 mg by mouth daily as needed.    [DISCONTINUED] irbesartan (AVAPRO) 150 MG tablet Take 1 tablet by mouth once daily   No facility-administered encounter medications on file as of 04/30/2023.     Lab Results  Component Value Date   WBC 6.2 06/22/2022   HGB 14.8 06/22/2022   HCT 43.2 06/22/2022   PLT 181.0 06/22/2022   GLUCOSE 141 (H) 04/16/2023   CHOL 123 04/16/2023   TRIG 68.0 04/16/2023   HDL 37.90 (L) 04/16/2023   LDLCALC 72 04/16/2023   ALT 15 04/16/2023   AST 14 04/16/2023   NA  141 04/16/2023   K 4.3 04/16/2023   CL 104 04/16/2023   CREATININE 1.09 04/16/2023   BUN 15 04/16/2023   CO2 27 04/16/2023   TSH 1.30 06/07/2022   PSA 1.37 06/07/2022   HGBA1C  7.4 (H) 04/16/2023   MICROALBUR <0.7 06/07/2022    US Abdomen Complete  Result Date: 06/22/2022 CLINICAL DATA:  Abdomen pain EXAM: ABDOMEN ULTRASOUND COMPLETE COMPARISON:  CT 03/12/2017, ultrasound 09/11/2016 FINDINGS: Gallbladder: No gallstones or wall thickening visualized. No sonographic Murphy sign noted by sonographer. Common bile duct: Diameter: 4 mm Liver: Heterogeneous echogenic liver parenchyma. No focal hepatic abnormality. Portal vein is patent on color Doppler imaging with normal direction of blood flow towards the liver. IVC: No abnormality visualized. Pancreas: Visualized portion unremarkable. Spleen: Size and appearance within normal limits. Right Kidney: Length: 11.5 cm. Echogenicity within normal limits. No mass or hydronephrosis visualized. Left Kidney: Length: 10.9 cm. Echogenicity within normal limits. No mass or hydronephrosis visualized. Abdominal aorta: No aneurysm visualized. Other findings: None. IMPRESSION: 1. Heterogeneous echogenic liver parenchyma consistent with hepatic steatosis. 2. Otherwise negative examination Electronically Signed   By: Jasmine Pang M.D.   On: 06/22/2022 15:36       Assessment & Plan:  Routine general medical examination at a health care facility  Healthcare maintenance Assessment & Plan: Physical today 04/30/23.  Colonoscopy 12/2022 - One 7 mm polyp in the ascending colon, one 3 mm polyp in the transverse colon, one 5 mm polyp in the rectum, one diminutive polyp in the rectum, mild diverticulosis in the sigmoid colon and in the transverse colon. Recommend f/u colonoscopy in 7 years. Check psa with next labs.    Colon cancer screening Assessment & Plan: Colonoscopy 12/2022 - One 7 mm polyp in the ascending colon. One 3 mm polyp in the transverse colon. One 5 mm polyp  in the rectum. One diminutive polyp in the rectum. Mild diverticulosis in the sigmoid colon and in the transverse colon. The distal rectum and anal verge were normal on  retroflexion view. Recommend repeat colonoscopy in 7 years per GI note.    Essential hypertension, benign Assessment & Plan: Continue avapro and amlodipine.  Blood pressure as outlined.  Follow pressures. Follow metabolic panel.    Hypercholesterolemia Assessment & Plan: On crestor.   Follow lipid panel and liver function tests.     Hypertension associated with type 2 diabetes mellitus (HCC) Assessment & Plan: Continue avapro and amlodipine.  Blood pressure as outlined.  Follow pressures. Follow metabolic panel.    Type 2 diabetes mellitus with hyperglycemia, without long-term current use of insulin ([facility]) Assessment & Plan: Low carb diet and exercise.  Continue on metformin.  Reported sugars improved.  Follow met b and a1c.    Stress Assessment & Plan: Increased stress.  Overall appears to be handling things relatively well.  Follow.    Other orders -     amLODIPine Besylate; Take 1 tablet (5 mg total) by mouth daily.  Dispense: 90 tablet; Refill: 1 -     Irbesartan; Take 1 tablet (150 mg total) by mouth daily.  Dispense: 90 tablet; Refill: 0     Dale [place], MD

## 2023-05-06 ENCOUNTER — Encounter: Payer: Self-pay | Admitting: Internal Medicine

## 2023-05-06 NOTE — Assessment & Plan Note (Signed)
Continue avapro and amlodipine.  Blood pressure as outlined.  Follow pressures. Follow metabolic panel.  

## 2023-05-06 NOTE — Assessment & Plan Note (Signed)
Low carb diet and exercise.  Continue on metformin.  Reported sugars improved.  Follow met b and a1c.

## 2023-05-06 NOTE — Assessment & Plan Note (Signed)
Increased stress.  Overall appears to be handling things relatively well.  Follow.  

## 2023-05-06 NOTE — Assessment & Plan Note (Signed)
Colonoscopy 12/2022 - One 7 mm polyp in the ascending colon. One 3 mm polyp in the transverse colon. One 5 mm polyp in the rectum. One diminutive polyp in the rectum. Mild diverticulosis in the sigmoid colon and in the transverse colon. The distal rectum and anal verge were normal on  retroflexion view. Recommend repeat colonoscopy in 7 years per GI note.

## 2023-05-06 NOTE — Assessment & Plan Note (Signed)
On crestor.  Follow lipid panel and liver function tests.   

## 2023-05-12 ENCOUNTER — Other Ambulatory Visit: Payer: Self-pay | Admitting: Internal Medicine

## 2023-05-14 ENCOUNTER — Other Ambulatory Visit: Payer: Self-pay | Admitting: Internal Medicine

## 2023-07-05 ENCOUNTER — Encounter: Payer: Self-pay | Admitting: Internal Medicine

## 2023-07-12 ENCOUNTER — Other Ambulatory Visit
Admission: RE | Admit: 2023-07-12 | Discharge: 2023-07-12 | Disposition: A | Discharge status: Home / Self Care | Payer: BC Managed Care – PPO | Attending: Internal Medicine | Admitting: Internal Medicine

## 2023-07-12 ENCOUNTER — Ambulatory Visit: Payer: BC Managed Care – PPO | Admitting: Nurse Practitioner

## 2023-07-12 ENCOUNTER — Encounter: Payer: Self-pay | Admitting: Nurse Practitioner

## 2023-07-12 ENCOUNTER — Other Ambulatory Visit
Admission: RE | Admit: 2023-07-12 | Discharge: 2023-07-12 | Disposition: A | Discharge status: Home / Self Care | Payer: BC Managed Care – PPO | Attending: Nurse Practitioner | Admitting: Nurse Practitioner

## 2023-07-12 VITALS — BP 118/78 | HR 63 | Temp 97.9°F | Ht 68.0 in | Wt 186.4 lb

## 2023-07-12 DIAGNOSIS — Z125 Encounter for screening for malignant neoplasm of prostate: Secondary | ICD-10-CM | POA: Diagnosis not present

## 2023-07-12 DIAGNOSIS — R1031 Right lower quadrant pain: Secondary | ICD-10-CM | POA: Diagnosis not present

## 2023-07-12 DIAGNOSIS — R1032 Left lower quadrant pain: Secondary | ICD-10-CM

## 2023-07-12 LAB — CBC WITH DIFFERENTIAL/PLATELET
Abs Immature Granulocytes: 0.02 10*3/uL (ref 0.00–0.07)
Basophils Absolute: 0 10*3/uL (ref 0.0–0.1)
Basophils Relative: 1 %
Eosinophils Absolute: 0.1 10*3/uL (ref 0.0–0.5)
Eosinophils Relative: 1 %
HCT: 43.8 % (ref 39.0–52.0)
Hemoglobin: 14.9 g/dL (ref 13.0–17.0)
Immature Granulocytes: 0 %
Lymphocytes Relative: 33 %
Lymphs Abs: 2.2 10*3/uL (ref 0.7–4.0)
MCH: 30.3 pg (ref 26.0–34.0)
MCHC: 34 g/dL (ref 30.0–36.0)
MCV: 89.2 fL (ref 80.0–100.0)
Monocytes Absolute: 0.5 10*3/uL (ref 0.1–1.0)
Monocytes Relative: 8 %
Neutro Abs: 3.8 10*3/uL (ref 1.7–7.7)
Neutrophils Relative %: 57 %
Platelets: 200 10*3/uL (ref 150–400)
RBC: 4.91 MIL/uL (ref 4.22–5.81)
RDW: 12.5 % (ref 11.5–15.5)
WBC: 6.7 10*3/uL (ref 4.0–10.5)
nRBC: 0 % (ref 0.0–0.2)

## 2023-07-12 LAB — PSA: Prostatic Specific Antigen: 2.17 ng/mL (ref 0.00–4.00)

## 2023-07-12 NOTE — Progress Notes (Signed)
Established Patient Office Visit  Subjective:  Patient ID: Jackson Cooper, male    DOB: Aug 18, 1966  Age: 57 y.o. MRN: [id number]  CC:  Chief Complaint  Patient presents with   Groin Pain    Inside of both thighs near groin right side hurts more than left    HPI  Jackson Cooper presents for pain in the groin area. He states that groin pain on the right sided started about 2-3 months ago while he was trying to get on his big bike. Recently he has noticed the pain in the left groin area. The pain is intermittent with specific movement.  He has noticed some non tender lump on the right groin area.    He denies any difficulty urinating, scrotal pain or abdominal pain.  Has not take any medication for it.  HPI   Past Medical History:  Diagnosis Date   Diabetes mellitus without complication (HCC)    DVT (deep venous thrombosis) (HCC) Diagnosed 9/08   Encephalitis 1970   Enlarged prostate    Hyperglycemia    Hyperlipidemia    Hypertension    Nephrolithiasis     Past Surgical History:  Procedure Laterality Date   EYE SURGERY  1972    Family History  Problem Relation Age of Onset   Hypertension Mother    Breast cancer Sister     Social History   Socioeconomic History   Marital status: Married    Spouse name: Not on file   Number of children: Not on file   Years of education: Not on file   Highest education level: Associate degree: occupational, Scientist, product/process development, or vocational program  Occupational History   Not on file  Tobacco Use   Smoking status: Never   Smokeless tobacco: Never  Substance and Sexual Activity   Alcohol use: No    Alcohol/week: 0.0 standard drinks of alcohol   Drug use: No   Sexual activity: Not on file  Other Topics Concern   Not on file  Social History Narrative   Not on file   Social Determinants of Health   Financial Resource Strain: Medium Risk (07/11/2023)   Overall Financial Resource Strain (CARDIA)    Difficulty of Paying Living  Expenses: Somewhat hard  Food Insecurity: Food Insecurity Present (07/11/2023)   Hunger Vital Sign    Worried About Running Out of Food in the Last Year: Sometimes true    Ran Out of Food in the Last Year: Sometimes true  Transportation Needs: No Transportation Needs (07/11/2023)   PRAPARE - Administrator, Civil Service (Medical): No    Lack of Transportation (Non-Medical): No  Physical Activity: Insufficiently Active (07/11/2023)   Exercise Vital Sign    Days of Exercise per Week: 2 days    Minutes of Exercise per Session: 20 min  Stress: Patient Declined (07/11/2023)   [employer] of Occupational Health - Occupational Stress Questionnaire    Feeling of Stress : Patient declined  Social Connections: Socially Integrated (07/11/2023)   Social Connection and Isolation Panel [NHANES]    Frequency of Communication with Friends and Family: More than three times a week    Frequency of Social Gatherings with Friends and Family: Three times a week    Attends Religious Services: More than 4 times per year    Active Member of Clubs or Organizations: Yes    Attends Banker Meetings: More than 4 times per year    Marital Status: Married  Catering manager  Violence: Not on file     Outpatient Medications Prior to Visit  Medication Sig Dispense Refill   amLODipine (NORVASC) 5 MG tablet Take 1 tablet (5 mg total) by mouth daily. 90 tablet 1   B Complex Vitamins (VITAMIN B-COMPLEX PO) Take 1 tablet by mouth daily.     Flaxseed Oil OIL Take 1,000 mg by mouth daily. 1000 mg     fluticasone (FLONASE) 50 MCG/ACT nasal spray Place 2 sprays into the nose daily. 16 g 2   irbesartan (AVAPRO) 150 MG tablet Take 1 tablet (150 mg total) by mouth daily. 90 tablet 0   Krill Oil 350 MG CAPS Take 1 capsule by mouth daily.     LORazepam (ATIVAN) 0.5 MG tablet Take 1/2 tablet prior to procedure.  May repeat x 1 if needed. 4 tablet 0   metFORMIN (GLUCOPHAGE-XR) 500 MG 24 hr tablet Take 1  tablet by mouth twice daily 180 tablet 1   rosuvastatin (CRESTOR) 5 MG tablet Take 1 tablet by mouth in the evening 90 tablet 3   No facility-administered medications prior to visit.    No Known Allergies  ROS Review of Systems Negative unless indicated in HPI.    Objective:    Physical Exam Constitutional:      Appearance: Normal appearance. He is normal weight.  Cardiovascular:     Rate and Rhythm: Normal rate and regular rhythm.     Pulses: Normal pulses.     Heart sounds: Normal heart sounds.  Abdominal:     General: Bowel sounds are normal.     Palpations: Abdomen is soft.     Tenderness: There is no abdominal tenderness. There is no right CVA tenderness or left CVA tenderness.  Genitourinary:    Comments: Tenderness with movement  Musculoskeletal:     Cervical back: Normal range of motion.  Neurological:     General: No focal deficit present.     Mental Status: He is alert. Mental status is at baseline.  Psychiatric:        Mood and Affect: Mood normal.        Behavior: Behavior normal.        Thought Content: Thought content normal.        Judgment: Judgment normal.     BP 118/78   Pulse 63   Temp 97.9 F (36.6 C) (Oral)   Ht 5\' 8"  (1.727 m)   Wt 186 lb 6.4 oz (84.6 kg)   SpO2 96%   BMI 28.34 kg/m  Wt Readings from Last 3 Encounters:  07/12/23 186 lb 6.4 oz (84.6 kg)  04/30/23 188 lb (85.3 kg)  06/22/22 190 lb (86.2 kg)     Health Maintenance  Topic Date Due   HIV Screening  Never done   Hepatitis C Screening  Never done   DTaP/Tdap/Td (1 - Tdap) Never done   Zoster Vaccines- Shingrix (1 of 2) Never done   COVID-19 Vaccine (3 - Pfizer risk series) 01/26/2020   OPHTHALMOLOGY EXAM  05/04/2022   FOOT EXAM  06/07/2023   Diabetic kidney evaluation - Urine ACR  06/08/2023   INFLUENZA VACCINE  02/25/2024 (Originally 06/28/2023)   HEMOGLOBIN A1C  10/17/2023   Diabetic kidney evaluation - eGFR measurement  04/15/2024   Colonoscopy  01/17/2033   HPV  VACCINES  Aged Out    There are no preventive care reminders to display for this patient.  Lab Results  Component Value Date   TSH 1.30 06/07/2022   Lab Results  Component Value Date   WBC 6.7 07/12/2023   HGB 14.9 07/12/2023   HCT 43.8 07/12/2023   MCV 89.2 07/12/2023   PLT 200 07/12/2023   Lab Results  Component Value Date   NA 141 04/16/2023   K 4.3 04/16/2023   CO2 27 04/16/2023   GLUCOSE 141 (H) 04/16/2023   BUN 15 04/16/2023   CREATININE 1.09 04/16/2023   BILITOT 0.5 04/16/2023   ALKPHOS 57 04/16/2023   AST 14 04/16/2023   ALT 15 04/16/2023   PROT 6.4 04/16/2023   ALBUMIN 4.3 04/16/2023   CALCIUM 9.4 04/16/2023   ANIONGAP 6 03/15/2017   GFR 74.72 04/16/2023   Lab Results  Component Value Date   CHOL 123 04/16/2023   Lab Results  Component Value Date   HDL 37.90 (L) 04/16/2023   Lab Results  Component Value Date   LDLCALC 72 04/16/2023   Lab Results  Component Value Date   TRIG 68.0 04/16/2023   Lab Results  Component Value Date   CHOLHDL 3 04/16/2023   Lab Results  Component Value Date   HGBA1C 7.4 (H) 04/16/2023      Assessment & Plan:  Bilateral groin pain Assessment & Plan: Advised to perform stretching exercises, use warm compress. Will check CBCs with differential. If the symptoms are not improving he will let us know and we will perform imaging for further evaluation.  Orders: -     CBC with Differential/Platelet; Future  Prostate cancer screening -     PSA; Future    Follow-up: Return if symptoms worsen or fail to improve.   Kara Dies, NP

## 2023-07-13 ENCOUNTER — Telehealth: Payer: Self-pay

## 2023-07-13 ENCOUNTER — Telehealth: Payer: Self-pay | Admitting: Nurse Practitioner

## 2023-07-13 NOTE — Telephone Encounter (Signed)
Patient states he is returning our call.

## 2023-07-16 NOTE — Telephone Encounter (Signed)
Attempted to call pt back.  No answer.  LMTCB  Message per Dr. Lorin Picket: Jackson Cooper Jackson Cooper yesterday.  PSA ordered.  PSA wnl.  Confirm doing ok.

## 2023-07-17 NOTE — Telephone Encounter (Signed)
noted 

## 2023-07-17 NOTE — Telephone Encounter (Signed)
Lvm for pt to call back if he had any questions

## 2023-07-17 NOTE — Telephone Encounter (Signed)
Pt was notified by Mia up front. LVM for pt to call back if he had any questions

## 2023-07-17 NOTE — Telephone Encounter (Signed)
Patient just returned call. I read message to him.

## 2023-07-21 DIAGNOSIS — R1031 Right lower quadrant pain: Secondary | ICD-10-CM | POA: Insufficient documentation

## 2023-07-21 DIAGNOSIS — R1032 Left lower quadrant pain: Secondary | ICD-10-CM | POA: Insufficient documentation

## 2023-07-21 NOTE — Assessment & Plan Note (Signed)
Advised to perform stretching exercises, use warm compress. Will check CBCs with differential. If the symptoms are not improving he will let us know and we will perform imaging for further evaluation.

## 2023-08-04 ENCOUNTER — Other Ambulatory Visit: Payer: Self-pay | Admitting: Internal Medicine

## 2023-08-13 ENCOUNTER — Encounter: Payer: Self-pay | Admitting: Internal Medicine

## 2023-08-13 NOTE — Telephone Encounter (Signed)
An enlarged prostate (which is not uncommon) can cause symptoms he is describing and and increased psa. When I see an increase in the psa, I usually repeat the level in 6 months, rather than wait a year, but if he is having symptoms (as outlined in his message), I recommend a referral to urology for further evaluation and treatment.

## 2023-08-30 ENCOUNTER — Telehealth: Payer: Self-pay | Admitting: Internal Medicine

## 2023-08-30 DIAGNOSIS — E7800 Pure hypercholesterolemia, unspecified: Secondary | ICD-10-CM

## 2023-08-30 DIAGNOSIS — E1165 Type 2 diabetes mellitus with hyperglycemia: Secondary | ICD-10-CM

## 2023-08-30 DIAGNOSIS — I10 Essential (primary) hypertension: Secondary | ICD-10-CM

## 2023-08-30 DIAGNOSIS — R972 Elevated prostate specific antigen [PSA]: Secondary | ICD-10-CM

## 2023-08-30 NOTE — Telephone Encounter (Signed)
Patient need lab orders.

## 2023-08-31 NOTE — Telephone Encounter (Signed)
Future labs ordered.  

## 2023-08-31 NOTE — Addendum Note (Signed)
Addended by: Rita Ohara D on: 08/31/2023 03:05 PM   Modules accepted: Orders

## 2023-09-04 ENCOUNTER — Other Ambulatory Visit: Payer: BC Managed Care – PPO

## 2023-09-09 ENCOUNTER — Encounter: Payer: Self-pay | Admitting: Internal Medicine

## 2023-09-11 ENCOUNTER — Ambulatory Visit: Payer: BC Managed Care – PPO | Admitting: Internal Medicine

## 2023-10-03 ENCOUNTER — Telehealth: Payer: Self-pay | Admitting: Internal Medicine

## 2023-10-03 NOTE — Telephone Encounter (Signed)
[facility] reordered for [facility]. PSA ordered.

## 2023-10-03 NOTE — Telephone Encounter (Signed)
Patient's wife just called and wants to know can they get some lab orders sent to [facility]. She said he will be going out of town soon for about a month. And he won't be able to come in for labs. His number is [phone].

## 2023-10-03 NOTE — Addendum Note (Signed)
Addended by: Rita Ohara D on: 10/03/2023 02:52 PM   Modules accepted: Orders

## 2023-10-03 NOTE — Telephone Encounter (Signed)
Ok to check psa with fasting labs.

## 2023-10-03 NOTE — Telephone Encounter (Signed)
Patient states he is wanting to reschedule his lab visit that was originally scheduled for 09/04/2023 and his visit with Dr. Dale [place] which was originally scheduled for 09/11/2023.  Patient states he had to cancel both appointments because he was out of town.  Patient states he will be leaving town again on 10/22/2023 and will return on 11/17/2023.  I scheduled an appointment for patient to see Dr. Lorin Picket on 10/16/2023, which is the first date/time that worked with his schedule and Dr. Roby Lofts.  Patient states he would like to go to [facility] to have his labs drawn.  Patient states he usually has to lay down for a little while when they draw his labs.  I let patient know that I will send a note to Dr. Westley Hummer Scott's nurse to see if they can change his lab order so he can go to the hospital.

## 2023-10-03 NOTE — Telephone Encounter (Signed)
Patient wants to go [facility] to his appt with you on 11/19. I will reorder his labs but he asked if he needs a PSA checked. Had one done in August and was a 2. Pt said that we had discussed rechecking. If so, I will add.

## 2023-10-03 NOTE — Telephone Encounter (Signed)
LM for Jackson Cooper. Is he wanting to have fasting labs done? Need to know if something is going on. Also patient needs an appt with Dr Lorin Picket once he returns

## 2023-10-15 ENCOUNTER — Other Ambulatory Visit: Payer: Self-pay

## 2023-10-15 ENCOUNTER — Other Ambulatory Visit
Admission: RE | Admit: 2023-10-15 | Discharge: 2023-10-15 | Disposition: A | Discharge status: Home / Self Care | Payer: BC Managed Care – PPO | Attending: Internal Medicine | Admitting: Internal Medicine

## 2023-10-15 DIAGNOSIS — I10 Essential (primary) hypertension: Secondary | ICD-10-CM

## 2023-10-15 DIAGNOSIS — E7800 Pure hypercholesterolemia, unspecified: Secondary | ICD-10-CM

## 2023-10-15 DIAGNOSIS — R972 Elevated prostate specific antigen [PSA]: Secondary | ICD-10-CM

## 2023-10-15 DIAGNOSIS — E1165 Type 2 diabetes mellitus with hyperglycemia: Secondary | ICD-10-CM

## 2023-10-15 LAB — CBC WITH DIFFERENTIAL/PLATELET
Abs Immature Granulocytes: 0.02 10*3/uL (ref 0.00–0.07)
Basophils Absolute: 0 10*3/uL (ref 0.0–0.1)
Basophils Relative: 1 %
Eosinophils Absolute: 0.2 10*3/uL (ref 0.0–0.5)
Eosinophils Relative: 3 %
HCT: 43 % (ref 39.0–52.0)
Hemoglobin: 14.9 g/dL (ref 13.0–17.0)
Immature Granulocytes: 0 %
Lymphocytes Relative: 33 %
Lymphs Abs: 1.9 10*3/uL (ref 0.7–4.0)
MCH: 30.3 pg (ref 26.0–34.0)
MCHC: 34.7 g/dL (ref 30.0–36.0)
MCV: 87.6 fL (ref 80.0–100.0)
Monocytes Absolute: 0.4 10*3/uL (ref 0.1–1.0)
Monocytes Relative: 7 %
Neutro Abs: 3.2 10*3/uL (ref 1.7–7.7)
Neutrophils Relative %: 56 %
Platelets: 181 10*3/uL (ref 150–400)
RBC: 4.91 MIL/uL (ref 4.22–5.81)
RDW: 12.2 % (ref 11.5–15.5)
WBC: 5.7 10*3/uL (ref 4.0–10.5)
nRBC: 0 % (ref 0.0–0.2)

## 2023-10-15 LAB — BASIC METABOLIC PANEL
Anion gap: 7 (ref 5–15)
BUN: 14 mg/dL (ref 6–20)
CO2: 26 mmol/L (ref 22–32)
Calcium: 8.9 mg/dL (ref 8.9–10.3)
Chloride: 107 mmol/L (ref 98–111)
Creatinine, Ser: 0.96 mg/dL (ref 0.61–1.24)
GFR, Estimated: >60 mL/min (ref >60)
Glucose, Bld: 139 mg/dL — ABNORMAL HIGH (ref 70–99)
Potassium: 4.3 mmol/L (ref 3.5–5.1)
Sodium: 140 mmol/L (ref 135–145)

## 2023-10-15 LAB — HEMOGLOBIN A1C
Hgb A1c MFr Bld: 6.9 % — ABNORMAL HIGH (ref 4.8–5.6)
Mean Plasma Glucose: 151.33 mg/dL

## 2023-10-15 LAB — TSH: TSH: 1.196 u[IU]/mL (ref 0.350–4.500)

## 2023-10-15 LAB — HEPATIC FUNCTION PANEL
ALT: 17 U/L (ref 0–44)
AST: 17 U/L (ref 15–41)
Albumin: 4.2 g/dL (ref 3.5–5.0)
Alkaline Phosphatase: 48 U/L (ref 38–126)
Bilirubin, Direct: 0.1 mg/dL (ref 0.0–0.2)
Indirect Bilirubin: 0.8 mg/dL (ref 0.3–0.9)
Total Bilirubin: 0.9 mg/dL (ref <1.2)
Total Protein: 6.8 g/dL (ref 6.5–8.1)

## 2023-10-15 LAB — LIPID PANEL
Cholesterol: 135 mg/dL (ref 0–200)
HDL: 39 mg/dL — ABNORMAL LOW (ref >40)
LDL Cholesterol: 84 mg/dL (ref 0–99)
Total CHOL/HDL Ratio: 3.5 {ratio}
Triglycerides: 58 mg/dL (ref <150)
VLDL: 12 mg/dL (ref 0–40)

## 2023-10-15 LAB — PSA: Prostatic Specific Antigen: 2.96 ng/mL (ref 0.00–4.00)

## 2023-10-15 NOTE — Telephone Encounter (Signed)
Labs re-ordered

## 2023-10-15 NOTE — Telephone Encounter (Signed)
Patient states he is at the lab in the [facility], but they are telling him the orders are not in system.  Patient states he did fast in case Dr. Dale [place] would like to do all of it.  I spoke with Rita Ohara, LPN, and she states she will put orders in again.  I relayed message to patient.

## 2023-10-16 ENCOUNTER — Ambulatory Visit: Payer: BC Managed Care – PPO | Admitting: Internal Medicine

## 2023-10-16 ENCOUNTER — Encounter: Payer: Self-pay | Admitting: Internal Medicine

## 2023-10-16 VITALS — BP 110/68 | HR 61 | Temp 98.2°F | Resp 16 | Ht 68.0 in | Wt 187.0 lb

## 2023-10-16 DIAGNOSIS — E7800 Pure hypercholesterolemia, unspecified: Secondary | ICD-10-CM | POA: Diagnosis not present

## 2023-10-16 DIAGNOSIS — I152 Hypertension secondary to endocrine disorders: Secondary | ICD-10-CM

## 2023-10-16 DIAGNOSIS — F439 Reaction to severe stress, unspecified: Secondary | ICD-10-CM

## 2023-10-16 DIAGNOSIS — E1159 Type 2 diabetes mellitus with other circulatory complications: Secondary | ICD-10-CM | POA: Diagnosis not present

## 2023-10-16 DIAGNOSIS — Z7984 Long term (current) use of oral hypoglycemic drugs: Secondary | ICD-10-CM

## 2023-10-16 DIAGNOSIS — E1165 Type 2 diabetes mellitus with hyperglycemia: Secondary | ICD-10-CM | POA: Diagnosis not present

## 2023-10-16 DIAGNOSIS — Z1211 Encounter for screening for malignant neoplasm of colon: Secondary | ICD-10-CM

## 2023-10-16 DIAGNOSIS — Z125 Encounter for screening for malignant neoplasm of prostate: Secondary | ICD-10-CM

## 2023-10-16 DIAGNOSIS — R39198 Other difficulties with micturition: Secondary | ICD-10-CM

## 2023-10-16 DIAGNOSIS — I10 Essential (primary) hypertension: Secondary | ICD-10-CM

## 2023-10-16 LAB — HM DIABETES FOOT EXAM

## 2023-10-16 MED ORDER — AMLODIPINE BESYLATE 5 MG PO TABS: 5.0000 mg | ORAL_TABLET | Freq: Every day | ORAL | 1 refills | Status: DC

## 2023-10-16 MED ORDER — METFORMIN HCL ER 500 MG PO TB24: 500.0000 mg | ORAL_TABLET | Freq: Two times a day (BID) | ORAL | 1 refills | Status: DC

## 2023-10-16 MED ORDER — IRBESARTAN 150 MG PO TABS: 150.0000 mg | ORAL_TABLET | Freq: Every day | ORAL | 1 refills | Status: DC

## 2023-10-16 NOTE — Progress Notes (Signed)
Subjective:    Patient ID: Jackson Cooper, male    DOB: May 25, 1966, 57 y.o.   MRN: [id number]  Patient here for  Chief Complaint  Patient presents with   Medical Management of Chronic Issues    HPI Here to follow up regarding diabetes, hypercholesterolemia and hypertension. He is accompanied by his wife.  History obtained from both of them. Continues on avapro and amlodipine. He is doing well.  Staying active. No chest pain or sob reported.  No abdominal pain or bowel change reported.  Reports noticing some minimal decrease in force of urine stream.  No urinary hesitancy. Discussed labs.  A1c improved. Discussed PSA.    Past Medical History:  Diagnosis Date   Diabetes mellitus without complication (HCC)    DVT (deep venous thrombosis) (HCC) Diagnosed 9/08   Encephalitis 1970   Enlarged prostate    Hyperglycemia    Hyperlipidemia    Hypertension    Nephrolithiasis    Past Surgical History:  Procedure Laterality Date   EYE SURGERY  1972   Family History  Problem Relation Age of Onset   Hypertension Mother    Breast cancer Sister    Social History   Socioeconomic History   Marital status: Married    Spouse name: Not on file   Number of children: Not on file   Years of education: Not on file   Highest education level: Associate degree: occupational, Scientist, product/process development, or vocational program  Occupational History   Not on file  Tobacco Use   Smoking status: Never   Smokeless tobacco: Never  Substance and Sexual Activity   Alcohol use: No    Alcohol/week: 0.0 standard drinks of alcohol   Drug use: No   Sexual activity: Not on file  Other Topics Concern   Not on file  Social History Narrative   Not on file   Social Determinants of Health   Financial Resource Strain: Medium Risk (07/11/2023)   Overall Financial Resource Strain (CARDIA)    Difficulty of Paying Living Expenses: Somewhat hard  Food Insecurity: Food Insecurity Present (07/11/2023)   Hunger Vital Sign     Worried About Running Out of Food in the Last Year: Sometimes true    Ran Out of Food in the Last Year: Sometimes true  Transportation Needs: No Transportation Needs (07/11/2023)   PRAPARE - Administrator, Civil Service (Medical): No    Lack of Transportation (Non-Medical): No  Physical Activity: Insufficiently Active (07/11/2023)   Exercise Vital Sign    Days of Exercise per Week: 2 days    Minutes of Exercise per Session: 20 min  Stress: Patient Declined (07/11/2023)   [employer] of Occupational Health - Occupational Stress Questionnaire    Feeling of Stress : Patient declined  Social Connections: Socially Integrated (07/11/2023)   Social Connection and Isolation Panel [NHANES]    Frequency of Communication with Friends and Family: More than three times a week    Frequency of Social Gatherings with Friends and Family: Three times a week    Attends Religious Services: More than 4 times per year    Active Member of Clubs or Organizations: Yes    Attends Banker Meetings: More than 4 times per year    Marital Status: Married     Review of Systems  Constitutional:  Negative for appetite change and unexpected weight change.  HENT:  Negative for congestion and sinus pressure.   Respiratory:  Negative for cough, chest tightness and  shortness of breath.   Cardiovascular:  Negative for chest pain and palpitations.  Gastrointestinal:  Negative for abdominal pain, diarrhea, nausea and vomiting.  Genitourinary:  Negative for difficulty urinating and dysuria.  Musculoskeletal:  Negative for joint swelling and myalgias.  Skin:  Negative for color change and rash.  Neurological:  Negative for dizziness and headaches.  Psychiatric/Behavioral:  Negative for agitation and dysphoric mood.        Objective:     BP 110/68   Pulse 61   Temp 98.2 F (36.8 C)   Resp 16   Ht 5\' 8"  (1.727 m)   Wt 187 lb (84.8 kg)   SpO2 98%   BMI 28.43 kg/m  Wt Readings from  Last 3 Encounters:  10/16/23 187 lb (84.8 kg)  07/12/23 186 lb 6.4 oz (84.6 kg)  04/30/23 188 lb (85.3 kg)    Physical Exam Constitutional:      General: He is not in acute distress.    Appearance: Normal appearance. He is well-developed.  HENT:     Head: Normocephalic and atraumatic.     Right Ear: External ear normal.     Left Ear: External ear normal.  Eyes:     General: No scleral icterus.       Right eye: No discharge.        Left eye: No discharge.  Cardiovascular:     Rate and Rhythm: Normal rate and regular rhythm.  Pulmonary:     Effort: Pulmonary effort is normal. No respiratory distress.     Breath sounds: Normal breath sounds.  Abdominal:     General: Bowel sounds are normal.     Palpations: Abdomen is soft.     Tenderness: There is no abdominal tenderness.  Musculoskeletal:        General: No swelling or tenderness.     Cervical back: Neck supple. No tenderness.  Lymphadenopathy:     Cervical: No cervical adenopathy.  Skin:    Findings: No erythema or rash.  Neurological:     Mental Status: He is alert.  Psychiatric:        Mood and Affect: Mood normal.        Behavior: Behavior normal.      Outpatient Encounter Medications as of 10/16/2023  Medication Sig   amLODipine (NORVASC) 5 MG tablet Take 1 tablet (5 mg total) by mouth daily.   B Complex Vitamins (VITAMIN B-COMPLEX PO) Take 1 tablet by mouth daily.   Flaxseed Oil OIL Take 1,000 mg by mouth daily. 1000 mg   fluticasone (FLONASE) 50 MCG/ACT nasal spray Place 2 sprays into the nose daily.   irbesartan (AVAPRO) 150 MG tablet Take 1 tablet (150 mg total) by mouth daily.   Krill Oil 350 MG CAPS Take 1 capsule by mouth daily.   LORazepam (ATIVAN) 0.5 MG tablet Take 1/2 tablet prior to procedure.  May repeat x 1 if needed.   metFORMIN (GLUCOPHAGE-XR) 500 MG 24 hr tablet Take 1 tablet (500 mg total) by mouth 2 (two) times daily.   rosuvastatin (CRESTOR) 5 MG tablet Take 1 tablet by mouth in the evening    [DISCONTINUED] amLODipine (NORVASC) 5 MG tablet Take 1 tablet (5 mg total) by mouth daily.   [DISCONTINUED] irbesartan (AVAPRO) 150 MG tablet Take 1 tablet by mouth once daily   [DISCONTINUED] metFORMIN (GLUCOPHAGE-XR) 500 MG 24 hr tablet Take 1 tablet by mouth twice daily   No facility-administered encounter medications on file as of 10/16/2023.  Lab Results  Component Value Date   WBC 5.7 10/15/2023   HGB 14.9 10/15/2023   HCT 43.0 10/15/2023   PLT 181 10/15/2023   GLUCOSE 139 (H) 10/15/2023   CHOL 135 10/15/2023   TRIG 58 10/15/2023   HDL 39 (L) 10/15/2023   LDLCALC 84 10/15/2023   ALT 17 10/15/2023   AST 17 10/15/2023   NA 140 10/15/2023   K 4.3 10/15/2023   CL 107 10/15/2023   CREATININE 0.96 10/15/2023   BUN 14 10/15/2023   CO2 26 10/15/2023   TSH 1.196 10/15/2023   PSA 1.37 06/07/2022   HGBA1C 6.9 (H) 10/15/2023   MICROALBUR <0.7 06/07/2022       Assessment & Plan:  Type 2 diabetes mellitus with hyperglycemia, without long-term current use of insulin (HCC) Assessment & Plan: Low carb diet and exercise.  Continue on metformin.  Reported sugars improved.  Follow met b and a1c.   Orders: -     Basic metabolic panel; Future -     Hemoglobin A1c; Future -     Microalbumin / creatinine urine ratio; Future  Stress Assessment & Plan: Overall appears to be handling things relatively well.  Follow.    Hypertension associated with type 2 diabetes mellitus (HCC) Assessment & Plan: Continue avapro and amlodipine.  Blood pressure as outlined.  Follow pressures. Follow metabolic panel.    Hypercholesterolemia Assessment & Plan: On crestor.   Follow lipid panel and liver function tests.    Orders: -     Hepatic function panel; Future -     Lipid panel; Future  Essential hypertension, benign Assessment & Plan: Continue avapro and amlodipine.  Blood pressure as outlined.  Follow pressures. Follow metabolic panel.    Colon cancer screening Assessment &  Plan: Colonoscopy 12/2022 - One 7 mm polyp in the ascending colon. One 3 mm polyp in the transverse colon. One 5 mm polyp in the rectum. One diminutive polyp in the rectum. Mild diverticulosis in the sigmoid colon and in the transverse colon. The distal rectum and anal verge were normal on  retroflexion view. Recommend repeat colonoscopy in 7 years per GI note.    Subjective change in urination Assessment & Plan: No significant hesitancy.  Reports stream may not as forceful.  No dysuria.  No significant symptoms.  Discussed PSA.  Follow.    Prostate cancer screening -     PSA; Future  Other orders -     amLODIPine Besylate; Take 1 tablet (5 mg total) by mouth daily.  Dispense: 90 tablet; Refill: 1 -     metFORMIN HCl ER; Take 1 tablet (500 mg total) by mouth 2 (two) times daily.  Dispense: 180 tablet; Refill: 1 -     Irbesartan; Take 1 tablet (150 mg total) by mouth daily.  Dispense: 90 tablet; Refill: 1     Dale [place], MD

## 2023-10-21 ENCOUNTER — Encounter: Payer: Self-pay | Admitting: Internal Medicine

## 2023-10-21 DIAGNOSIS — R39198 Other difficulties with micturition: Secondary | ICD-10-CM | POA: Insufficient documentation

## 2023-10-21 NOTE — Assessment & Plan Note (Signed)
Low carb diet and exercise.  Continue on metformin.  Reported sugars improved.  Follow met b and a1c.

## 2023-10-21 NOTE — Assessment & Plan Note (Signed)
Continue avapro and amlodipine.  Blood pressure as outlined.  Follow pressures. Follow metabolic panel.

## 2023-10-21 NOTE — Assessment & Plan Note (Signed)
On crestor.  Follow lipid panel and liver function tests.

## 2023-10-21 NOTE — Assessment & Plan Note (Signed)
Colonoscopy 12/2022 - One 7 mm polyp in the ascending colon. One 3 mm polyp in the transverse colon. One 5 mm polyp in the rectum. One diminutive polyp in the rectum. Mild diverticulosis in the sigmoid colon and in the transverse colon. The distal rectum and anal verge were normal on  retroflexion view. Recommend repeat colonoscopy in 7 years per GI note.

## 2023-10-21 NOTE — Assessment & Plan Note (Signed)
Continue avapro and amlodipine.  Blood pressure as outlined.  Follow pressures. Follow metabolic panel.  

## 2023-10-21 NOTE — Assessment & Plan Note (Signed)
Overall appears to be handling things relatively well.  Follow.   

## 2023-10-21 NOTE — Assessment & Plan Note (Addendum)
No significant hesitancy.  Reports stream may not as forceful.  No dysuria.  No significant symptoms.  Discussed PSA.  Follow.

## 2024-01-07 ENCOUNTER — Telehealth: Payer: Self-pay

## 2024-01-07 NOTE — Telephone Encounter (Signed)
 Last labs 10/15/23.  Too soon to do labs now.

## 2024-01-07 NOTE — Telephone Encounter (Signed)
 Copied from CRM [id number]. Topic: Clinical - Request for Lab/Test Order >> Jan 07, 2024 11:31 AM Annelle Kiel wrote: Reason for CRM: patient is requesting psa labs be done before his appt next Tuesday

## 2024-01-07 NOTE — Telephone Encounter (Signed)
 LMTCB

## 2024-01-07 NOTE — Telephone Encounter (Signed)
 Pt had a bunch of labs done on 10/15/2023 which included a PSA. Does pt need labs again before his appt next Tuesday?

## 2024-01-08 NOTE — Telephone Encounter (Signed)
Appt for feb cancelled. Patient has lab appointment and f/u with Dr Lorin Picket scheduled in March

## 2024-01-15 ENCOUNTER — Ambulatory Visit: Payer: Self-pay | Admitting: Internal Medicine

## 2024-01-25 ENCOUNTER — Encounter: Payer: Self-pay | Admitting: Internal Medicine

## 2024-01-25 NOTE — Telephone Encounter (Signed)
 Are you ok with this?

## 2024-01-25 NOTE — Telephone Encounter (Signed)
 I am ok with referral. Please call and confirm his current symptoms and confirm does not need to be evaluated sooner.

## 2024-01-29 NOTE — Telephone Encounter (Signed)
 No new symptoms but he did feel something new on the left side of his scrotum when doing self exam in shower that felt to be about the size of a BB. Scheduled for appt this week to be evaluated. Will place urology referral at appt.

## 2024-01-30 ENCOUNTER — Ambulatory Visit: Admitting: Internal Medicine

## 2024-01-30 ENCOUNTER — Encounter: Payer: Self-pay | Admitting: Internal Medicine

## 2024-01-30 VITALS — BP 130/86 | HR 74 | Temp 98.9°F | Ht 68.0 in | Wt 191.4 lb

## 2024-01-30 DIAGNOSIS — E1159 Type 2 diabetes mellitus with other circulatory complications: Secondary | ICD-10-CM

## 2024-01-30 DIAGNOSIS — I152 Hypertension secondary to endocrine disorders: Secondary | ICD-10-CM | POA: Diagnosis not present

## 2024-01-30 DIAGNOSIS — Z7984 Long term (current) use of oral hypoglycemic drugs: Secondary | ICD-10-CM | POA: Diagnosis not present

## 2024-01-30 DIAGNOSIS — N5089 Other specified disorders of the male genital organs: Secondary | ICD-10-CM

## 2024-01-30 NOTE — Progress Notes (Signed)
 Subjective:    Patient ID: Jackson Cooper, male    DOB: 1966-03-20, 58 y.o.   MRN: [id number]  Patient here for  Chief Complaint  Patient presents with   Acute Visit    Something about the size of a bb in his left scrotum for a few days    HPI Work in appt - work in with concerns regarding palpable BB size lesion - left testicle. States he was in the shower several day ago. Noticed a nodule - BB size - left testicle. No pain. Has not palpated since. No urine or bowel change. No abdominal pain. Also reports some increased sneezing and nasal congestion. Some drainage. Has taken zyrtec and flonase prn over the last 10 days - since symptoms started. No chest congestion. No cough. No sob.    Past Medical History:  Diagnosis Date   Diabetes mellitus without complication (HCC)    DVT (deep venous thrombosis) (HCC) Diagnosed 9/08   Encephalitis 1970   Enlarged prostate    Hyperglycemia    Hyperlipidemia    Hypertension    Nephrolithiasis    Past Surgical History:  Procedure Laterality Date   EYE SURGERY  1972   Family History  Problem Relation Age of Onset   Hypertension Mother    Breast cancer Sister    Social History   Socioeconomic History   Marital status: Married    Spouse name: Not on file   Number of children: Not on file   Years of education: Not on file   Highest education level: Some college, no degree  Occupational History   Not on file  Tobacco Use   Smoking status: Never   Smokeless tobacco: Never  Substance and Sexual Activity   Alcohol use: No    Alcohol/week: 0.0 standard drinks of alcohol   Drug use: No   Sexual activity: Not on file  Other Topics Concern   Not on file  Social History Narrative   Not on file   Social Drivers of Health   Financial Resource Strain: Low Risk  (01/29/2024)   Overall Financial Resource Strain (CARDIA)    Difficulty of Paying Living Expenses: Not hard at all  Food Insecurity: No Food Insecurity (01/29/2024)   Hunger  Vital Sign    Worried About Running Out of Food in the Last Year: Never true    Ran Out of Food in the Last Year: Never true  Transportation Needs: No Transportation Needs (01/29/2024)   PRAPARE - Administrator, Civil Service (Medical): No    Lack of Transportation (Non-Medical): No  Physical Activity: Insufficiently Active (01/29/2024)   Exercise Vital Sign    Days of Exercise per Week: 3 days    Minutes of Exercise per Session: 20 min  Stress: No Stress Concern Present (01/29/2024)   [employer] of Occupational Health - Occupational Stress Questionnaire    Feeling of Stress : Only a little  Social Connections: Socially Integrated (01/29/2024)   Social Connection and Isolation Panel [NHANES]    Frequency of Communication with Friends and Family: More than three times a week    Frequency of Social Gatherings with Friends and Family: More than three times a week    Attends Religious Services: More than 4 times per year    Active Member of [employer] or Organizations: Yes    Attends Engineer, structural: More than 4 times per year    Marital Status: Married     Review of Systems  Constitutional:  Negative for appetite change, fever and unexpected weight change.  HENT:  Positive for congestion and postnasal drip. Negative for sinus pressure.   Respiratory:  Negative for cough, chest tightness and shortness of breath.   Cardiovascular:  Negative for chest pain, palpitations and leg swelling.  Gastrointestinal:  Negative for abdominal pain, diarrhea, nausea and vomiting.  Genitourinary:  Negative for difficulty urinating and dysuria.  Musculoskeletal:  Negative for joint swelling and myalgias.  Skin:  Negative for color change and rash.  Neurological:  Negative for dizziness and light-headedness.  Psychiatric/Behavioral:  Negative for agitation and dysphoric mood.        Objective:     BP 130/86   Pulse 74   Temp 98.9 F (37.2 C)   Ht 5\' 8"  (1.727 m)   Wt 191  lb 6.4 oz (86.8 kg)   SpO2 97%   BMI 29.10 kg/m  Wt Readings from Last 3 Encounters:  01/30/24 191 lb 6.4 oz (86.8 kg)  10/16/23 187 lb (84.8 kg)  07/12/23 186 lb 6.4 oz (84.6 kg)    Physical Exam Vitals reviewed.  Constitutional:      General: He is not in acute distress.    Appearance: Normal appearance. He is well-developed.  HENT:     Head: Normocephalic and atraumatic.     Right Ear: External ear normal.     Left Ear: External ear normal.     Mouth/Throat:     Pharynx: No oropharyngeal exudate or posterior oropharyngeal erythema.  Eyes:     General: No scleral icterus.       Right eye: No discharge.        Left eye: No discharge.     Conjunctiva/sclera: Conjunctivae normal.  Cardiovascular:     Rate and Rhythm: Normal rate and regular rhythm.  Pulmonary:     Effort: Pulmonary effort is normal. No respiratory distress.     Breath sounds: Normal breath sounds.  Abdominal:     General: Bowel sounds are normal.     Palpations: Abdomen is soft.     Tenderness: There is no abdominal tenderness.  Genitourinary:    Comments: Normal descended testicles. No palpable nodule. No pain.  Musculoskeletal:        General: No swelling.     Cervical back: Neck supple. No tenderness.  Lymphadenopathy:     Cervical: No cervical adenopathy.  Skin:    Findings: No erythema or rash.  Neurological:     Mental Status: He is alert.  Psychiatric:        Mood and Affect: Mood normal.        Behavior: Behavior normal.         Outpatient Encounter Medications as of 01/30/2024  Medication Sig   amLODipine (NORVASC) 5 MG tablet Take 1 tablet (5 mg total) by mouth daily.   B Complex Vitamins (VITAMIN B-COMPLEX PO) Take 1 tablet by mouth daily.   Flaxseed Oil OIL Take 1,000 mg by mouth daily. 1000 mg   fluticasone (FLONASE) 50 MCG/ACT nasal spray Place 2 sprays into the nose daily.   irbesartan (AVAPRO) 150 MG tablet Take 1 tablet (150 mg total) by mouth daily.   Krill Oil 350 MG CAPS  Take 1 capsule by mouth daily.   LORazepam (ATIVAN) 0.5 MG tablet Take 1/2 tablet prior to procedure.  May repeat x 1 if needed.   metFORMIN (GLUCOPHAGE-XR) 500 MG 24 hr tablet Take 1 tablet (500 mg total) by mouth 2 (two) times daily.  rosuvastatin (CRESTOR) 5 MG tablet Take 1 tablet by mouth in the evening   No facility-administered encounter medications on file as of 01/30/2024.     Lab Results  Component Value Date   WBC 5.7 10/15/2023   HGB 14.9 10/15/2023   HCT 43.0 10/15/2023   PLT 181 10/15/2023   GLUCOSE 139 (H) 10/15/2023   CHOL 135 10/15/2023   TRIG 58 10/15/2023   HDL 39 (L) 10/15/2023   LDLCALC 84 10/15/2023   ALT 17 10/15/2023   AST 17 10/15/2023   NA 140 10/15/2023   K 4.3 10/15/2023   CL 107 10/15/2023   CREATININE 0.96 10/15/2023   BUN 14 10/15/2023   CO2 26 10/15/2023   TSH 1.196 10/15/2023   PSA 1.37 06/07/2022   HGBA1C 6.9 (H) 10/15/2023   MICROALBUR <0.7 06/07/2022       Assessment & Plan:  Testicular nodule Assessment & Plan: He palpated in the shower. No pain. Could not appreciate nodule on exam today. Will check scrotal ultrasound to further evaluate. Notify if any change.   Orders: -     US SCROTUM; Future  Hypertension associated with type 2 diabetes mellitus (HCC) Assessment & Plan: Blood pressure as outlined.  Will follow. Hold on making changes. Follow. Scheduled for f/u.       Dale Anthony, MD

## 2024-01-30 NOTE — Assessment & Plan Note (Addendum)
 He palpated in the shower. No pain. Could not appreciate nodule on exam today. Will check scrotal ultrasound to further evaluate. Notify if any change.

## 2024-01-30 NOTE — Assessment & Plan Note (Signed)
 Blood pressure as outlined.  Will follow. Hold on making changes. Follow. Scheduled for f/u.

## 2024-02-01 ENCOUNTER — Ambulatory Visit
Admission: RE | Admit: 2024-02-01 | Discharge: 2024-02-01 | Disposition: A | Discharge status: Home / Self Care | Source: Ambulatory Visit | Attending: Internal Medicine | Admitting: Internal Medicine

## 2024-02-01 DIAGNOSIS — N5089 Other specified disorders of the male genital organs: Secondary | ICD-10-CM | POA: Insufficient documentation

## 2024-02-12 ENCOUNTER — Other Ambulatory Visit (INDEPENDENT_AMBULATORY_CARE_PROVIDER_SITE_OTHER): Payer: BC Managed Care – PPO

## 2024-02-12 DIAGNOSIS — E7800 Pure hypercholesterolemia, unspecified: Secondary | ICD-10-CM

## 2024-02-12 DIAGNOSIS — Z125 Encounter for screening for malignant neoplasm of prostate: Secondary | ICD-10-CM

## 2024-02-12 DIAGNOSIS — E1165 Type 2 diabetes mellitus with hyperglycemia: Secondary | ICD-10-CM

## 2024-02-12 LAB — LIPID PANEL
Cholesterol: 140 mg/dL (ref 0–200)
HDL: 40 mg/dL (ref >39.00)
LDL Cholesterol: 76 mg/dL (ref 0–99)
NonHDL: 100.32
Total CHOL/HDL Ratio: 4
Triglycerides: 122 mg/dL (ref 0.0–149.0)
VLDL: 24.4 mg/dL (ref 0.0–40.0)

## 2024-02-12 LAB — HEPATIC FUNCTION PANEL
ALT: 18 U/L (ref 0–53)
AST: 16 U/L (ref 0–37)
Albumin: 4.6 g/dL (ref 3.5–5.2)
Alkaline Phosphatase: 50 U/L (ref 39–117)
Bilirubin, Direct: 0.2 mg/dL (ref 0.0–0.3)
Total Bilirubin: 0.8 mg/dL (ref 0.2–1.2)
Total Protein: 6.6 g/dL (ref 6.0–8.3)

## 2024-02-12 LAB — MICROALBUMIN / CREATININE URINE RATIO
Creatinine,U: 89.8 mg/dL
Microalb Creat Ratio: UNDETERMINED mg/g (ref 0.0–30.0)
Microalb, Ur: <0.7 mg/dL

## 2024-02-12 LAB — BASIC METABOLIC PANEL
BUN: 18 mg/dL (ref 6–23)
CO2: 28 meq/L (ref 19–32)
Calcium: 9.5 mg/dL (ref 8.4–10.5)
Chloride: 103 meq/L (ref 96–112)
Creatinine, Ser: 1.05 mg/dL (ref 0.40–1.50)
GFR: 77.7 mL/min (ref >60.00)
Glucose, Bld: 131 mg/dL — ABNORMAL HIGH (ref 70–99)
Potassium: 4.4 meq/L (ref 3.5–5.1)
Sodium: 139 meq/L (ref 135–145)

## 2024-02-12 LAB — PSA: PSA: 2.21 ng/mL (ref 0.10–4.00)

## 2024-02-12 LAB — HEMOGLOBIN A1C: Hgb A1c MFr Bld: 7.6 % — ABNORMAL HIGH (ref 4.6–6.5)

## 2024-02-19 ENCOUNTER — Encounter: Payer: Self-pay | Admitting: Internal Medicine

## 2024-02-19 ENCOUNTER — Ambulatory Visit (INDEPENDENT_AMBULATORY_CARE_PROVIDER_SITE_OTHER): Payer: BC Managed Care – PPO | Admitting: Internal Medicine

## 2024-02-19 VITALS — BP 114/76 | HR 57 | Temp 98.0°F | Ht 68.0 in | Wt 191.6 lb

## 2024-02-19 DIAGNOSIS — I10 Essential (primary) hypertension: Secondary | ICD-10-CM | POA: Diagnosis not present

## 2024-02-19 DIAGNOSIS — Z1211 Encounter for screening for malignant neoplasm of colon: Secondary | ICD-10-CM

## 2024-02-19 DIAGNOSIS — E7800 Pure hypercholesterolemia, unspecified: Secondary | ICD-10-CM

## 2024-02-19 DIAGNOSIS — F439 Reaction to severe stress, unspecified: Secondary | ICD-10-CM

## 2024-02-19 DIAGNOSIS — E1165 Type 2 diabetes mellitus with hyperglycemia: Secondary | ICD-10-CM | POA: Diagnosis not present

## 2024-02-19 DIAGNOSIS — M25512 Pain in left shoulder: Secondary | ICD-10-CM | POA: Diagnosis not present

## 2024-02-19 DIAGNOSIS — Z7984 Long term (current) use of oral hypoglycemic drugs: Secondary | ICD-10-CM

## 2024-02-19 LAB — HM DIABETES FOOT EXAM

## 2024-02-19 MED ORDER — ROSUVASTATIN CALCIUM 5 MG PO TABS: 5.0000 mg | ORAL_TABLET | Freq: Every evening | ORAL | 3 refills | Status: AC

## 2024-02-19 MED ORDER — FREESTYLE LIBRE 3 READER DEVI: 0 refills | Status: DC

## 2024-02-19 MED ORDER — IRBESARTAN 150 MG PO TABS: 150.0000 mg | ORAL_TABLET | Freq: Every day | ORAL | 1 refills | Status: DC

## 2024-02-19 MED ORDER — METFORMIN HCL ER 500 MG PO TB24: 500.0000 mg | ORAL_TABLET | Freq: Two times a day (BID) | ORAL | 1 refills | Status: DC

## 2024-02-19 MED ORDER — AMLODIPINE BESYLATE 5 MG PO TABS: 5.0000 mg | ORAL_TABLET | Freq: Every day | ORAL | 1 refills | Status: DC

## 2024-02-19 NOTE — Progress Notes (Signed)
 Subjective:    Patient ID: Jackson Cooper, male    DOB: [birth year], 58 y.o.   MRN: [id number]  Patient here for  Chief Complaint  Patient presents with   Medical Management of Chronic Issues    HPI Here for a scheduled follow up - follow up regarding hypercholesterolemia, hypertension and diabetes. Continues on avapro and amlodipine. Recent A1c elevated - 7.6. He has not been watching his diet. Not exercising regularly. Discussed treatment options. Discussed diet and exercise. Breathing stable. Some occasional constipation. Discussed increased water intake and exercise. Some left shoulder discomfort. Movement aggravates. No known injury.    Past Medical History:  Diagnosis Date   Diabetes mellitus without complication (HCC)    DVT (deep venous thrombosis) (HCC) Diagnosed 9/08   Encephalitis 1970   Enlarged prostate    Hyperglycemia    Hyperlipidemia    Hypertension    Nephrolithiasis    Past Surgical History:  Procedure Laterality Date   EYE SURGERY  1972   Family History  Problem Relation Age of Onset   Hypertension Mother    Breast cancer Sister    Social History   Socioeconomic History   Marital status: Married    Spouse name: Not on file   Number of children: Not on file   Years of education: Not on file   Highest education level: Some college, no degree  Occupational History   Not on file  Tobacco Use   Smoking status: Never   Smokeless tobacco: Never  Substance and Sexual Activity   Alcohol use: No    Alcohol/week: 0.0 standard drinks of alcohol   Drug use: No   Sexual activity: Not on file  Other Topics Concern   Not on file  Social History Narrative   Not on file   Social Drivers of Health   Financial Resource Strain: Low Risk  (01/29/2024)   Overall Financial Resource Strain (CARDIA)    Difficulty of Paying Living Expenses: Not hard at all  Food Insecurity: No Food Insecurity (01/29/2024)   Hunger Vital Sign    Worried About Running Out of Food  in the Last Year: Never true    Ran Out of Food in the Last Year: Never true  Transportation Needs: No Transportation Needs (01/29/2024)   PRAPARE - Administrator, Civil Service (Medical): No    Lack of Transportation (Non-Medical): No  Physical Activity: Insufficiently Active (01/29/2024)   Exercise Vital Sign    Days of Exercise per Week: 3 days    Minutes of Exercise per Session: 20 min  Stress: No Stress Concern Present (01/29/2024)   [employer] of Occupational Health - Occupational Stress Questionnaire    Feeling of Stress : Only a little  Social Connections: Socially Integrated (01/29/2024)   Social Connection and Isolation Panel [NHANES]    Frequency of Communication with Friends and Family: More than three times a week    Frequency of Social Gatherings with Friends and Family: More than three times a week    Attends Religious Services: More than 4 times per year    Active Member of [employer] or Organizations: Yes    Attends Engineer, structural: More than 4 times per year    Marital Status: Married     Review of Systems  Constitutional:  Negative for fatigue and unexpected weight change.  HENT:  Negative for congestion and sinus pressure.   Respiratory:  Negative for cough, chest tightness and shortness of breath.  Cardiovascular:  Negative for chest pain and palpitations.  Gastrointestinal:  Negative for abdominal pain, diarrhea, nausea and vomiting.  Genitourinary:  Negative for difficulty urinating and dysuria.  Musculoskeletal:  Negative for joint swelling and myalgias.       Left shoulder pain as outlined.   Skin:  Negative for color change and rash.  Neurological:  Negative for dizziness and headaches.  Psychiatric/Behavioral:  Negative for agitation and dysphoric mood.        Objective:     BP 114/76   Pulse (!) 57   Temp 98 F (36.7 C)   Ht 5\' 8"  (1.727 m)   Wt 191 lb 9.6 oz (86.9 kg)   SpO2 97%   BMI 29.13 kg/m  Wt Readings from  Last 3 Encounters:  02/19/24 191 lb 9.6 oz (86.9 kg)  01/30/24 191 lb 6.4 oz (86.8 kg)  10/16/23 187 lb (84.8 kg)    Physical Exam Vitals reviewed.  Constitutional:      General: He is not in acute distress.    Appearance: Normal appearance. He is well-developed.  HENT:     Head: Normocephalic and atraumatic.     Right Ear: External ear normal.     Left Ear: External ear normal.     Mouth/Throat:     Pharynx: No oropharyngeal exudate or posterior oropharyngeal erythema.  Eyes:     General: No scleral icterus.       Right eye: No discharge.        Left eye: No discharge.     Conjunctiva/sclera: Conjunctivae normal.  Cardiovascular:     Rate and Rhythm: Normal rate and regular rhythm.  Pulmonary:     Effort: Pulmonary effort is normal. No respiratory distress.     Breath sounds: Normal breath sounds.  Abdominal:     General: Bowel sounds are normal.     Palpations: Abdomen is soft.     Tenderness: There is no abdominal tenderness.  Musculoskeletal:        General: No swelling or tenderness.     Cervical back: Neck supple. No tenderness.     Comments: Increased pain with abduction - especially above 90 degrees. Some decrease with passive rom - but still with pain = left shoulder.   Lymphadenopathy:     Cervical: No cervical adenopathy.  Skin:    Findings: No erythema or rash.  Neurological:     Mental Status: He is alert.  Psychiatric:        Mood and Affect: Mood normal.        Behavior: Behavior normal.      Diabetic foot exam was performed with the following findings:   No deformities, ulcerations, or other skin breakdown Normal sensation of 10g monofilament Intact posterior tibialis and dorsalis pedis pulses      Outpatient Encounter Medications as of 02/19/2024  Medication Sig   B Complex Vitamins (VITAMIN B-COMPLEX PO) Take 1 tablet by mouth daily.   Continuous Glucose Receiver (FREESTYLE LIBRE 3 READER) DEVI USE TO MONITOR BLOOD SUGARS CONTINUOUSLY.    Flaxseed Oil OIL Take 1,000 mg by mouth daily. 1000 mg   fluticasone (FLONASE) 50 MCG/ACT nasal spray Place 2 sprays into the nose daily.   Krill Oil 350 MG CAPS Take 1 capsule by mouth daily.   LORazepam (ATIVAN) 0.5 MG tablet Take 1/2 tablet prior to procedure.  May repeat x 1 if needed.   amLODipine (NORVASC) 5 MG tablet Take 1 tablet (5 mg total) by mouth daily.  irbesartan (AVAPRO) 150 MG tablet Take 1 tablet (150 mg total) by mouth daily.   metFORMIN (GLUCOPHAGE-XR) 500 MG 24 hr tablet Take 1 tablet (500 mg total) by mouth 2 (two) times daily.   rosuvastatin (CRESTOR) 5 MG tablet Take 1 tablet (5 mg total) by mouth every evening.   [DISCONTINUED] amLODipine (NORVASC) 5 MG tablet Take 1 tablet (5 mg total) by mouth daily.   [DISCONTINUED] irbesartan (AVAPRO) 150 MG tablet Take 1 tablet (150 mg total) by mouth daily.   [DISCONTINUED] metFORMIN (GLUCOPHAGE-XR) 500 MG 24 hr tablet Take 1 tablet (500 mg total) by mouth 2 (two) times daily.   [DISCONTINUED] rosuvastatin (CRESTOR) 5 MG tablet Take 1 tablet by mouth in the evening   No facility-administered encounter medications on file as of 02/19/2024.     Lab Results  Component Value Date   WBC 5.7 10/15/2023   HGB 14.9 10/15/2023   HCT 43.0 10/15/2023   PLT 181 10/15/2023   GLUCOSE 131 (H) 02/12/2024   CHOL 140 02/12/2024   TRIG 122.0 02/12/2024   HDL 40.00 02/12/2024   LDLCALC 76 02/12/2024   ALT 18 02/12/2024   AST 16 02/12/2024   NA 139 02/12/2024   K 4.4 02/12/2024   CL 103 02/12/2024   CREATININE 1.05 02/12/2024   BUN 18 02/12/2024   CO2 28 02/12/2024   TSH 1.196 10/15/2023   PSA 2.21 02/12/2024   HGBA1C 7.6 (H) 02/12/2024   MICROALBUR <0.7 02/12/2024    US SCROTUM W/DOPPLER Result Date: 02/02/2024 CLINICAL DATA:  Left palpated testicular nodule. EXAM: SCROTAL ULTRASOUND DOPPLER ULTRASOUND OF THE TESTICLES TECHNIQUE: Complete ultrasound examination of the testicles, epididymis, and other scrotal structures was  performed. Color and spectral Doppler ultrasound were also utilized to evaluate blood flow to the testicles. COMPARISON:  None Available. FINDINGS: Right testicle Measurements: 4.3 x 2.7 x 3.2 cm. No mass or microlithiasis visualized. Left testicle Measurements: 4.6 x 2.3 x 2.9 cm. No mass or microlithiasis visualized. Right epididymis:  Normal in size and appearance. Left epididymis:  Normal in size and appearance. Hydrocele:  None visualized. Varicocele:  None visualized. Pulsed Doppler interrogation of both testes demonstrates normal low resistance arterial and venous waveforms bilaterally. IMPRESSION: No acute process. Electronically Signed   By: Annia Belt M.D.   On: 02/02/2024 16:30       Assessment & Plan:  Colon cancer screening Assessment & Plan: Colonoscopy 12/2022 - One 7 mm polyp in the ascending colon. One 3 mm polyp in the transverse colon. One 5 mm polyp in the rectum. One diminutive polyp in the rectum. Mild diverticulosis in the sigmoid colon and in the transverse colon. The distal rectum and anal verge were normal on  retroflexion view. Recommend repeat colonoscopy in 7 years per GI note.    Type 2 diabetes mellitus with hyperglycemia, without long-term current use of insulin (HCC) Assessment & Plan: Low carb diet and exercise.  Continues on metformin.  Discussed recent labs and elevated A1c. Has not been watching what he eats. Discussed diet and exercise. Discussed other treatment options. Declines other medication at this time. Discussed CGM. Phone not compatible. Rx for reader. Send in readings over the next couple of weeks. If persistent elevation, will require further medication. Follow met b and a1c.   Orders: -     Basic metabolic panel with GFR; Future -     Hemoglobin A1c; Future -     FreeStyle Libre 3 Reader; USE TO MONITOR BLOOD SUGARS CONTINUOUSLY.  Dispense:  1 each; Refill: 0  Hypercholesterolemia Assessment & Plan: On crestor.   Follow lipid panel and liver  function tests.  No changes today.   Lab Results  Component Value Date   CHOL 140 02/12/2024   HDL 40.00 02/12/2024   LDLCALC 76 02/12/2024   TRIG 122.0 02/12/2024   CHOLHDL 4 02/12/2024     Orders: -     Hepatic function panel; Future -     Lipid panel; Future  Essential hypertension, benign Assessment & Plan: Continue avapro and amlodipine.  Blood pressure as outlined.  Follow pressures. Follow metabolic panel. No changes today.    Left shoulder pain, unspecified chronicity Assessment & Plan: Shoulder pain as outlined. Noticed with abduction. Consider PT. Notify me if desires further intervention or referral.    Stress Assessment & Plan: Overall appears to be handling things relatively well. Follow.    Other orders -     amLODIPine Besylate; Take 1 tablet (5 mg total) by mouth daily.  Dispense: 90 tablet; Refill: 1 -     Irbesartan; Take 1 tablet (150 mg total) by mouth daily.  Dispense: 90 tablet; Refill: 1 -     metFORMIN HCl ER; Take 1 tablet (500 mg total) by mouth 2 (two) times daily.  Dispense: 180 tablet; Refill: 1 -     Rosuvastatin Calcium; Take 1 tablet (5 mg total) by mouth every evening.  Dispense: 90 tablet; Refill: 3     Dale Deuel, MD

## 2024-02-24 ENCOUNTER — Encounter: Payer: Self-pay | Admitting: Internal Medicine

## 2024-02-24 NOTE — Assessment & Plan Note (Signed)
Overall appears to be handling things relatively well.  Follow.   

## 2024-02-24 NOTE — Assessment & Plan Note (Signed)
 Colonoscopy 12/2022 - One 7 mm polyp in the ascending colon. One 3 mm polyp in the transverse colon. One 5 mm polyp in the rectum. One diminutive polyp in the rectum. Mild diverticulosis in the sigmoid colon and in the transverse colon. The distal rectum and anal verge were normal on  retroflexion view. Recommend repeat colonoscopy in 7 years per GI note.

## 2024-02-24 NOTE — Assessment & Plan Note (Signed)
 Low carb diet and exercise.  Continues on metformin.  Discussed recent labs and elevated A1c. Has not been watching what he eats. Discussed diet and exercise. Discussed other treatment options. Declines other medication at this time. Discussed CGM. Phone not compatible. Rx for reader. Send in readings over the next couple of weeks. If persistent elevation, will require further medication. Follow met b and a1c.

## 2024-02-24 NOTE — Assessment & Plan Note (Signed)
 Continue avapro and amlodipine.  Blood pressure as outlined.  Follow pressures. Follow metabolic panel. No changes today.

## 2024-02-24 NOTE — Assessment & Plan Note (Signed)
 Shoulder pain as outlined. Noticed with abduction. Consider PT. Notify me if desires further intervention or referral.

## 2024-02-24 NOTE — Assessment & Plan Note (Signed)
 On crestor.   Follow lipid panel and liver function tests.  No changes today.   Lab Results  Component Value Date   CHOL 140 02/12/2024   HDL 40.00 02/12/2024   LDLCALC 76 02/12/2024   TRIG 122.0 02/12/2024   CHOLHDL 4 02/12/2024

## 2024-05-19 ENCOUNTER — Other Ambulatory Visit (INDEPENDENT_AMBULATORY_CARE_PROVIDER_SITE_OTHER)

## 2024-05-19 DIAGNOSIS — E7800 Pure hypercholesterolemia, unspecified: Secondary | ICD-10-CM

## 2024-05-19 DIAGNOSIS — E1165 Type 2 diabetes mellitus with hyperglycemia: Secondary | ICD-10-CM

## 2024-05-19 LAB — BASIC METABOLIC PANEL WITH GFR
BUN: 12 mg/dL (ref 6–23)
CO2: 30 meq/L (ref 19–32)
Calcium: 9.4 mg/dL (ref 8.4–10.5)
Chloride: 103 meq/L (ref 96–112)
Creatinine, Ser: 1.05 mg/dL (ref 0.40–1.50)
GFR: 77.55 mL/min (ref >60.00)
Glucose, Bld: 144 mg/dL — ABNORMAL HIGH (ref 70–99)
Potassium: 4.3 meq/L (ref 3.5–5.1)
Sodium: 140 meq/L (ref 135–145)

## 2024-05-19 LAB — LIPID PANEL
Cholesterol: 130 mg/dL (ref 0–200)
HDL: 35.5 mg/dL — ABNORMAL LOW (ref >39.00)
LDL Cholesterol: 78 mg/dL (ref 0–99)
NonHDL: 94.62
Total CHOL/HDL Ratio: 4
Triglycerides: 81 mg/dL (ref 0.0–149.0)
VLDL: 16.2 mg/dL (ref 0.0–40.0)

## 2024-05-19 LAB — HEPATIC FUNCTION PANEL
ALT: 21 U/L (ref 0–53)
AST: 20 U/L (ref 0–37)
Albumin: 4.5 g/dL (ref 3.5–5.2)
Alkaline Phosphatase: 49 U/L (ref 39–117)
Bilirubin, Direct: 0.2 mg/dL (ref 0.0–0.3)
Total Bilirubin: 0.8 mg/dL (ref 0.2–1.2)
Total Protein: 6.6 g/dL (ref 6.0–8.3)

## 2024-05-19 LAB — HEMOGLOBIN A1C: Hgb A1c MFr Bld: 7.6 % — ABNORMAL HIGH (ref 4.6–6.5)

## 2024-05-20 ENCOUNTER — Ambulatory Visit: Payer: Self-pay | Admitting: Internal Medicine

## 2024-05-27 ENCOUNTER — Ambulatory Visit: Payer: Self-pay | Admitting: Internal Medicine

## 2024-05-27 ENCOUNTER — Ambulatory Visit (INDEPENDENT_AMBULATORY_CARE_PROVIDER_SITE_OTHER): Admitting: Internal Medicine

## 2024-05-27 ENCOUNTER — Encounter: Payer: Self-pay | Admitting: Internal Medicine

## 2024-05-27 ENCOUNTER — Ambulatory Visit (INDEPENDENT_AMBULATORY_CARE_PROVIDER_SITE_OTHER)

## 2024-05-27 VITALS — BP 120/70 | HR 96 | Temp 97.9°F | Ht 68.0 in | Wt 189.9 lb

## 2024-05-27 DIAGNOSIS — M25512 Pain in left shoulder: Secondary | ICD-10-CM

## 2024-05-27 DIAGNOSIS — E7800 Pure hypercholesterolemia, unspecified: Secondary | ICD-10-CM

## 2024-05-27 DIAGNOSIS — Z7984 Long term (current) use of oral hypoglycemic drugs: Secondary | ICD-10-CM

## 2024-05-27 DIAGNOSIS — I10 Essential (primary) hypertension: Secondary | ICD-10-CM | POA: Diagnosis not present

## 2024-05-27 DIAGNOSIS — E1165 Type 2 diabetes mellitus with hyperglycemia: Secondary | ICD-10-CM

## 2024-05-27 DIAGNOSIS — Z1211 Encounter for screening for malignant neoplasm of colon: Secondary | ICD-10-CM

## 2024-05-27 DIAGNOSIS — M79672 Pain in left foot: Secondary | ICD-10-CM

## 2024-05-27 NOTE — Progress Notes (Signed)
 Subjective:    Patient ID: Jackson Cooper, male    DOB: 1966/08/10, 58 y.o.   MRN: [id number]  Patient here for  Chief Complaint  Patient presents with   Medical Management of Chronic Issues    HPI Here for a scheduled follow up - follow up regarding hypercholesterolemia, hypertension and diabetes. Continues on avapro  and amlodipine . Recent A1c remains elevated 7.6. continues on metformin . CGM placed today. Discussed other treatment options. Discussed SGLT 2 inhibitors and GLP 1 agonist. Desires not to add medication at this time. Wants to work on diet and exercise and monitor sugars. Has not been following pressures. Does report left shoulder pain. Persistent pain. Pain - with abduction - above 90 degrees. Also reports - foot pain - appears to be c/w plantar fasciitis. Has been wearing flip flops. Discussed stretches.   Past Medical History:  Diagnosis Date   Diabetes mellitus without complication (HCC)    DVT (deep venous thrombosis) (HCC) Diagnosed 9/08   Encephalitis 1970   Enlarged prostate    Hyperglycemia    Hyperlipidemia    Hypertension    Nephrolithiasis    Past Surgical History:  Procedure Laterality Date   EYE SURGERY  1972   Family History  Problem Relation Age of Onset   Hypertension Mother    Breast cancer Sister    Social History   Socioeconomic History   Marital status: Married    Spouse name: Not on file   Number of children: Not on file   Years of education: Not on file   Highest education level: Some college, no degree  Occupational History   Not on file  Tobacco Use   Smoking status: Never   Smokeless tobacco: Never  Substance and Sexual Activity   Alcohol use: No    Alcohol/week: 0.0 standard drinks of alcohol   Drug use: No   Sexual activity: Not on file  Other Topics Concern   Not on file  Social History Narrative   Not on file   Social Drivers of Health   Financial Resource Strain: Low Risk  (05/23/2024)   Overall Financial Resource  Strain (CARDIA)    Difficulty of Paying Living Expenses: Not hard at all  Food Insecurity: No Food Insecurity (05/23/2024)   Hunger Vital Sign    Worried About Running Out of Food in the Last Year: Never true    Ran Out of Food in the Last Year: Never true  Transportation Needs: No Transportation Needs (05/23/2024)   PRAPARE - Administrator, Civil Service (Medical): No    Lack of Transportation (Non-Medical): No  Physical Activity: Insufficiently Active (05/23/2024)   Exercise Vital Sign    Days of Exercise per Week: 4 days    Minutes of Exercise per Session: 30 min  Stress: No Stress Concern Present (05/23/2024)   [employer] of Occupational Health - Occupational Stress Questionnaire    Feeling of Stress: Not at all  Social Connections: Socially Integrated (05/23/2024)   Social Connection and Isolation Panel    Frequency of Communication with Friends and Family: More than three times a week    Frequency of Social Gatherings with Friends and Family: Three times a week    Attends Religious Services: More than 4 times per year    Active Member of Clubs or Organizations: Yes    Attends Banker Meetings: More than 4 times per year    Marital Status: Married     Review of Systems  Constitutional:  Negative for appetite change and unexpected weight change.  HENT:  Negative for congestion and sinus pressure.   Respiratory:  Negative for cough, chest tightness and shortness of breath.   Cardiovascular:  Negative for chest pain, palpitations and leg swelling.  Gastrointestinal:  Negative for abdominal pain, diarrhea, nausea and vomiting.  Genitourinary:  Negative for difficulty urinating and dysuria.  Musculoskeletal:  Negative for myalgias.       Left shoulder pain. Left foot pain.   Skin:  Negative for color change and rash.  Neurological:  Negative for dizziness and headaches.  Psychiatric/Behavioral:  Negative for agitation and dysphoric mood.         Objective:     BP 120/70   Pulse 96   Temp 97.9 F (36.6 C) (Oral)   Ht 5' 8 (1.727 m)   Wt 189 lb 14.4 oz (86.1 kg)   SpO2 99%   BMI 28.87 kg/m  Wt Readings from Last 3 Encounters:  05/27/24 189 lb 14.4 oz (86.1 kg)  02/19/24 191 lb 9.6 oz (86.9 kg)  01/30/24 191 lb 6.4 oz (86.8 kg)    Physical Exam Vitals reviewed.  Constitutional:      General: He is not in acute distress.    Appearance: Normal appearance. He is well-developed.  HENT:     Head: Normocephalic and atraumatic.     Right Ear: External ear normal.     Left Ear: External ear normal.  Eyes:     General: No scleral icterus.       Right eye: No discharge.        Left eye: No discharge.     Conjunctiva/sclera: Conjunctivae normal.  Cardiovascular:     Rate and Rhythm: Normal rate and regular rhythm.  Pulmonary:     Effort: Pulmonary effort is normal. No respiratory distress.     Breath sounds: Normal breath sounds.  Abdominal:     General: Bowel sounds are normal.     Palpations: Abdomen is soft.     Tenderness: There is no abdominal tenderness.  Musculoskeletal:        General: No swelling.     Cervical back: Neck supple. No tenderness.     Comments: Increased shoulder pain - noticed more at or above 90 degrees - abduction. Increased pain - left foot - plantar surface - arch.   Lymphadenopathy:     Cervical: No cervical adenopathy.  Skin:    Findings: No erythema or rash.  Neurological:     Mental Status: He is alert.  Psychiatric:        Mood and Affect: Mood normal.        Behavior: Behavior normal.         Outpatient Encounter Medications as of 05/27/2024  Medication Sig   amLODipine  (NORVASC ) 5 MG tablet Take 1 tablet (5 mg total) by mouth daily.   B Complex Vitamins (VITAMIN B-COMPLEX PO) Take 1 tablet by mouth daily.   Continuous Glucose Receiver (FREESTYLE LIBRE 3 READER) DEVI USE TO MONITOR BLOOD SUGARS CONTINUOUSLY.   Flaxseed Oil OIL Take 1,000 mg by mouth daily. 1000 mg    fluticasone  (FLONASE ) 50 MCG/ACT nasal spray Place 2 sprays into the nose daily.   irbesartan  (AVAPRO ) 150 MG tablet Take 1 tablet (150 mg total) by mouth daily.   Krill Oil 350 MG CAPS Take 1 capsule by mouth daily.   LORazepam  (ATIVAN ) 0.5 MG tablet Take 1/2 tablet prior to procedure.  May repeat x 1 if  needed.   metFORMIN  (GLUCOPHAGE -XR) 500 MG 24 hr tablet Take 1 tablet (500 mg total) by mouth 2 (two) times daily.   rosuvastatin  (CRESTOR ) 5 MG tablet Take 1 tablet (5 mg total) by mouth every evening.   No facility-administered encounter medications on file as of 05/27/2024.     Lab Results  Component Value Date   WBC 5.7 10/15/2023   HGB 14.9 10/15/2023   HCT 43.0 10/15/2023   PLT 181 10/15/2023   GLUCOSE 144 (H) 05/19/2024   CHOL 130 05/19/2024   TRIG 81.0 05/19/2024   HDL 35.50 (L) 05/19/2024   LDLCALC 78 05/19/2024   ALT 21 05/19/2024   AST 20 05/19/2024   NA 140 05/19/2024   K 4.3 05/19/2024   CL 103 05/19/2024   CREATININE 1.05 05/19/2024   BUN 12 05/19/2024   CO2 30 05/19/2024   TSH 1.196 10/15/2023   PSA 2.21 02/12/2024   HGBA1C 7.6 (H) 05/19/2024   MICROALBUR <0.7 02/12/2024    US  SCROTUM W/DOPPLER Result Date: 02/02/2024 CLINICAL DATA:  Left palpated testicular nodule. EXAM: SCROTAL ULTRASOUND DOPPLER ULTRASOUND OF THE TESTICLES TECHNIQUE: Complete ultrasound examination of the testicles, epididymis, and other scrotal structures was performed. Color and spectral Doppler ultrasound were also utilized to evaluate blood flow to the testicles. COMPARISON:  None Available. FINDINGS: Right testicle Measurements: 4.3 x 2.7 x 3.2 cm. No mass or microlithiasis visualized. Left testicle Measurements: 4.6 x 2.3 x 2.9 cm. No mass or microlithiasis visualized. Right epididymis:  Normal in size and appearance. Left epididymis:  Normal in size and appearance. Hydrocele:  None visualized. Varicocele:  None visualized. Pulsed Doppler interrogation of both testes demonstrates normal low  resistance arterial and venous waveforms bilaterally. IMPRESSION: No acute process. Electronically Signed   By: Bard Moats M.D.   On: 02/02/2024 16:30       Assessment & Plan:  Left shoulder pain, unspecified chronicity Assessment & Plan: Persistent shoulder pain. Check xray. Further w/up pending results.   Orders: -     DG Shoulder Left; Future  Colon cancer screening Assessment & Plan: Colonoscopy 12/2022 - One 7 mm polyp in the ascending colon. One 3 mm polyp in the transverse colon. One 5 mm polyp in the rectum. One diminutive polyp in the rectum. Mild diverticulosis in the sigmoid colon and in the transverse colon. The distal rectum and anal verge were normal on  retroflexion view. Recommend repeat colonoscopy in 7 years per GI note.    Essential hypertension, benign Assessment & Plan: Continue avapro  and amlodipine .  Blood pressure as outlined.  Follow pressures. Follow metabolic panel. No changes today.    Hypercholesterolemia Assessment & Plan: On crestor .   Follow lipid panel and liver function tests.  No changes today.  Lab Results  Component Value Date   CHOL 130 05/19/2024   HDL 35.50 (L) 05/19/2024   LDLCALC 78 05/19/2024   TRIG 81.0 05/19/2024   CHOLHDL 4 05/19/2024      Type 2 diabetes mellitus with hyperglycemia, without long-term current use of insulin (HCC) Assessment & Plan: Low carb diet and exercise.  Continues on metformin .  Discussed recent labs and elevated A1c. Discussed diet and exercise. Discussed other treatment options. Discussed SGLT 2 inhibitors and GLP 1 agonist. Declines other medication at this time. Discussed CGM. Placed today. Wants to work on diet and exercise. Follow sugars. Send in readings.    Left foot pain Assessment & Plan: Appears to be c/w plantar fasciitis. Discussed supports. Stretches. Is better. Follow.  Avoid going barefooted.       Allena Hamilton, MD

## 2024-05-27 NOTE — Telephone Encounter (Signed)
 Copied from CRM [id number]. Topic: Clinical - Lab/Test Results >> May 27, 2024  4:22 PM Rea C wrote: Reason for CRM: Patient returned call. Relayed lab results and patient had no further questions.

## 2024-06-01 ENCOUNTER — Encounter: Payer: Self-pay | Admitting: Internal Medicine

## 2024-06-01 DIAGNOSIS — M79672 Pain in left foot: Secondary | ICD-10-CM | POA: Insufficient documentation

## 2024-06-01 NOTE — Assessment & Plan Note (Signed)
 Persistent shoulder pain. Check xray. Further w/up pending results.

## 2024-06-01 NOTE — Assessment & Plan Note (Signed)
 Low carb diet and exercise.  Continues on metformin .  Discussed recent labs and elevated A1c. Discussed diet and exercise. Discussed other treatment options. Discussed SGLT 2 inhibitors and GLP 1 agonist. Declines other medication at this time. Discussed CGM. Placed today. Wants to work on diet and exercise. Follow sugars. Send in readings.

## 2024-06-01 NOTE — Assessment & Plan Note (Signed)
 Colonoscopy 12/2022 - One 7 mm polyp in the ascending colon. One 3 mm polyp in the transverse colon. One 5 mm polyp in the rectum. One diminutive polyp in the rectum. Mild diverticulosis in the sigmoid colon and in the transverse colon. The distal rectum and anal verge were normal on  retroflexion view. Recommend repeat colonoscopy in 7 years per GI note.

## 2024-06-01 NOTE — Assessment & Plan Note (Signed)
 Continue avapro and amlodipine.  Blood pressure as outlined.  Follow pressures. Follow metabolic panel. No changes today.

## 2024-06-01 NOTE — Assessment & Plan Note (Signed)
 On crestor .   Follow lipid panel and liver function tests.  No changes today.  Lab Results  Component Value Date   CHOL 130 05/19/2024   HDL 35.50 (L) 05/19/2024   LDLCALC 78 05/19/2024   TRIG 81.0 05/19/2024   CHOLHDL 4 05/19/2024

## 2024-06-01 NOTE — Assessment & Plan Note (Signed)
 Appears to be c/w plantar fasciitis. Discussed supports. Stretches. Is better. Follow. Avoid going barefooted.

## 2024-06-18 ENCOUNTER — Encounter: Payer: Self-pay | Admitting: Internal Medicine

## 2024-08-11 ENCOUNTER — Other Ambulatory Visit: Payer: Self-pay | Admitting: Internal Medicine

## 2024-08-18 ENCOUNTER — Telehealth: Payer: Self-pay | Admitting: Internal Medicine

## 2024-08-18 DIAGNOSIS — E7800 Pure hypercholesterolemia, unspecified: Secondary | ICD-10-CM

## 2024-08-18 DIAGNOSIS — E1165 Type 2 diabetes mellitus with hyperglycemia: Secondary | ICD-10-CM

## 2024-08-18 DIAGNOSIS — R972 Elevated prostate specific antigen [PSA]: Secondary | ICD-10-CM

## 2024-08-18 DIAGNOSIS — I10 Essential (primary) hypertension: Secondary | ICD-10-CM

## 2024-08-18 NOTE — Telephone Encounter (Signed)
 Lab order needed

## 2024-08-19 NOTE — Telephone Encounter (Signed)
 Pt agreeable and PSA ordered.

## 2024-08-19 NOTE — Telephone Encounter (Signed)
 If pt agreeable, I would like to recheck - even though in the normal range, did increase from previous check and just want to make sure stable.

## 2024-08-19 NOTE — Telephone Encounter (Signed)
 Labs ordered for him. Does he need a PSA checked? Last one was 01/2024 level was 2.21

## 2024-08-29 ENCOUNTER — Other Ambulatory Visit

## 2024-08-29 DIAGNOSIS — I10 Essential (primary) hypertension: Secondary | ICD-10-CM

## 2024-08-29 DIAGNOSIS — R972 Elevated prostate specific antigen [PSA]: Secondary | ICD-10-CM

## 2024-08-29 DIAGNOSIS — E7800 Pure hypercholesterolemia, unspecified: Secondary | ICD-10-CM

## 2024-08-29 DIAGNOSIS — E1165 Type 2 diabetes mellitus with hyperglycemia: Secondary | ICD-10-CM

## 2024-08-29 NOTE — Addendum Note (Signed)
 Addended by: MARYLEN PRO A on: 08/29/2024 08:44 AM   Modules accepted: Orders

## 2024-08-30 ENCOUNTER — Ambulatory Visit: Payer: Self-pay | Admitting: Internal Medicine

## 2024-08-31 LAB — BASIC METABOLIC PANEL WITH GFR
BUN/Creatinine Ratio: 17 (ref 10–24)
BUN: 16 mg/dL (ref 8–27)
CO2: 24 mmol/L (ref 20–29)
Calcium: 9.7 mg/dL (ref 8.6–10.2)
Chloride: 102 mmol/L (ref 96–106)
Creatinine, Ser: 0.94 mg/dL (ref 0.76–1.27)
Glucose: 129 mg/dL — ABNORMAL HIGH (ref 70–99)
Potassium: 4.5 mmol/L (ref 3.5–5.2)
Sodium: 138 mmol/L (ref 134–144)
eGFR: 93 mL/min/1.73 (ref >59)

## 2024-08-31 LAB — HEPATIC FUNCTION PANEL
ALT: 16 IU/L (ref 0–44)
AST: 12 IU/L (ref 0–40)
Albumin: 4.5 g/dL (ref 3.8–4.9)
Alkaline Phosphatase: 60 IU/L (ref 47–123)
Bilirubin Total: 1.1 mg/dL (ref 0.0–1.2)
Bilirubin, Direct: 0.3 mg/dL (ref 0.00–0.40)
Total Protein: 6.6 g/dL (ref 6.0–8.5)

## 2024-08-31 LAB — PSA: Prostate Specific Ag, Serum: 4.3 ng/mL — AB (ref 0.0–4.0)

## 2024-08-31 LAB — CBC WITH DIFFERENTIAL/PLATELET
Basophils Absolute: 0 x10E3/uL (ref 0.0–0.2)
Basos: 1 %
EOS (ABSOLUTE): 0.1 x10E3/uL (ref 0.0–0.4)
Eos: 2 %
Hematocrit: 46.3 % (ref 37.5–51.0)
Hemoglobin: 15.5 g/dL (ref 13.0–17.7)
Immature Grans (Abs): 0 x10E3/uL (ref 0.0–0.1)
Immature Granulocytes: 0 %
Lymphocytes Absolute: 2 x10E3/uL (ref 0.7–3.1)
Lymphs: 31 %
MCH: 31 pg (ref 26.6–33.0)
MCHC: 33.5 g/dL (ref 31.5–35.7)
MCV: 93 fL (ref 79–97)
Monocytes Absolute: 0.6 x10E3/uL (ref 0.1–0.9)
Monocytes: 8 %
Neutrophils Absolute: 3.8 x10E3/uL (ref 1.4–7.0)
Neutrophils: 58 %
Platelets: 194 x10E3/uL (ref 150–450)
RBC: 5 x10E6/uL (ref 4.14–5.80)
RDW: 12.8 % (ref 11.6–15.4)
WBC: 6.6 x10E3/uL (ref 3.4–10.8)

## 2024-08-31 LAB — LIPID PANEL
Chol/HDL Ratio: 4 ratio (ref 0.0–5.0)
Cholesterol, Total: 158 mg/dL (ref 100–199)
HDL: 40 mg/dL (ref >39)
LDL Chol Calc (NIH): 96 mg/dL (ref 0–99)
Triglycerides: 123 mg/dL (ref 0–149)
VLDL Cholesterol Cal: 22 mg/dL (ref 5–40)

## 2024-08-31 LAB — HEMOGLOBIN A1C
Est. average glucose Bld gHb Est-mCnc: 148 mg/dL
Hgb A1c MFr Bld: 6.8 % — ABNORMAL HIGH (ref 4.8–5.6)

## 2024-08-31 LAB — TSH: TSH: 1.24 u[IU]/mL (ref 0.450–4.500)

## 2024-09-02 ENCOUNTER — Ambulatory Visit: Admitting: Internal Medicine

## 2024-09-02 VITALS — BP 116/70 | HR 84 | Resp 16 | Ht 68.0 in | Wt 185.0 lb

## 2024-09-02 DIAGNOSIS — I152 Hypertension secondary to endocrine disorders: Secondary | ICD-10-CM

## 2024-09-02 DIAGNOSIS — E1165 Type 2 diabetes mellitus with hyperglycemia: Secondary | ICD-10-CM | POA: Diagnosis not present

## 2024-09-02 DIAGNOSIS — Z0000 Encounter for general adult medical examination without abnormal findings: Secondary | ICD-10-CM

## 2024-09-02 DIAGNOSIS — I10 Essential (primary) hypertension: Secondary | ICD-10-CM

## 2024-09-02 DIAGNOSIS — L989 Disorder of the skin and subcutaneous tissue, unspecified: Secondary | ICD-10-CM

## 2024-09-02 DIAGNOSIS — R972 Elevated prostate specific antigen [PSA]: Secondary | ICD-10-CM | POA: Diagnosis not present

## 2024-09-02 DIAGNOSIS — E1159 Type 2 diabetes mellitus with other circulatory complications: Secondary | ICD-10-CM

## 2024-09-02 DIAGNOSIS — F439 Reaction to severe stress, unspecified: Secondary | ICD-10-CM

## 2024-09-02 DIAGNOSIS — E7800 Pure hypercholesterolemia, unspecified: Secondary | ICD-10-CM | POA: Diagnosis not present

## 2024-09-02 DIAGNOSIS — Z1211 Encounter for screening for malignant neoplasm of colon: Secondary | ICD-10-CM

## 2024-09-02 DIAGNOSIS — Z7984 Long term (current) use of oral hypoglycemic drugs: Secondary | ICD-10-CM

## 2024-09-02 MED ORDER — IRBESARTAN 150 MG PO TABS: 150.0000 mg | ORAL_TABLET | Freq: Every day | ORAL | 1 refills | Status: AC

## 2024-09-02 MED ORDER — AMLODIPINE BESYLATE 5 MG PO TABS: 5.0000 mg | ORAL_TABLET | Freq: Every day | ORAL | 1 refills | Status: AC

## 2024-09-02 MED ORDER — FREESTYLE LIBRE 3 PLUS SENSOR MISC: 5 refills | Status: DC

## 2024-09-02 MED ORDER — METFORMIN HCL ER 500 MG PO TB24: 500.0000 mg | ORAL_TABLET | Freq: Two times a day (BID) | ORAL | 1 refills | Status: AC

## 2024-09-02 NOTE — Progress Notes (Signed)
 Subjective:    Patient ID: Jackson Cooper, male    DOB: 1966/01/13, 58 y.o.   MRN: [id number]  Patient here for  Chief Complaint  Patient presents with   Annual Exam    HPI Here for a physical exam. Continues on avapro  and amlodipine  for his blood pressure. Last visit, A1c elevated. Elected to work on diet and exercise. Recent A1c 6.8 (08/29/24). Improved. Continues on metformin . Discussed labs. Continue diet and exercise. Breathing stable. Taking omeprazole every other day. Controlling upper symptoms. No increased dysphagia or acid reflux. No abdominal pain or bowel change. Discussed elevated psa. Discussed referral to urology.  Discussed calcium  score.    Past Medical History:  Diagnosis Date   Diabetes mellitus without complication (HCC)    DVT (deep venous thrombosis) (HCC) Diagnosed 9/08   Encephalitis 1970   Enlarged prostate    Hyperglycemia    Hyperlipidemia    Hypertension    Nephrolithiasis    Past Surgical History:  Procedure Laterality Date   EYE SURGERY  1972   Family History  Problem Relation Age of Onset   Hypertension Mother    Breast cancer Sister    Social History   Socioeconomic History   Marital status: Married    Spouse name: Not on file   Number of children: Not on file   Years of education: Not on file   Highest education level: Some college, no degree  Occupational History   Not on file  Tobacco Use   Smoking status: Never   Smokeless tobacco: Never  Substance and Sexual Activity   Alcohol use: No    Alcohol/week: 0.0 standard drinks of alcohol   Drug use: No   Sexual activity: Not on file  Other Topics Concern   Not on file  Social History Narrative   Not on file   Social Drivers of Health   Financial Resource Strain: Low Risk  (05/23/2024)   Overall Financial Resource Strain (CARDIA)    Difficulty of Paying Living Expenses: Not hard at all  Food Insecurity: No Food Insecurity (05/23/2024)   Hunger Vital Sign    Worried About  Running Out of Food in the Last Year: Never true    Ran Out of Food in the Last Year: Never true  Transportation Needs: No Transportation Needs (05/23/2024)   PRAPARE - Administrator, Civil Service (Medical): No    Lack of Transportation (Non-Medical): No  Physical Activity: Insufficiently Active (05/23/2024)   Exercise Vital Sign    Days of Exercise per Week: 4 days    Minutes of Exercise per Session: 30 min  Stress: No Stress Concern Present (05/23/2024)   [employer] of Occupational Health - Occupational Stress Questionnaire    Feeling of Stress: Not at all  Social Connections: Socially Integrated (05/23/2024)   Social Connection and Isolation Panel    Frequency of Communication with Friends and Family: More than three times a week    Frequency of Social Gatherings with Friends and Family: Three times a week    Attends Religious Services: More than 4 times per year    Active Member of Clubs or Organizations: Yes    Attends Banker Meetings: More than 4 times per year    Marital Status: Married     Review of Systems  Constitutional:  Negative for appetite change and unexpected weight change.  HENT:  Negative for congestion, sinus pressure and sore throat.   Eyes:  Negative for pain and  visual disturbance.  Respiratory:  Negative for cough, chest tightness and shortness of breath.   Cardiovascular:  Negative for chest pain, palpitations and leg swelling.  Gastrointestinal:  Negative for abdominal pain, diarrhea, nausea and vomiting.  Genitourinary:  Negative for difficulty urinating and dysuria.  Musculoskeletal:  Negative for joint swelling and myalgias.  Skin:  Negative for color change and rash.  Neurological:  Negative for dizziness and headaches.  Hematological:  Negative for adenopathy. Does not bruise/bleed easily.  Psychiatric/Behavioral:  Negative for agitation and dysphoric mood.        Objective:     BP 116/70   Pulse 84   Resp 16    Ht 5' 8 (1.727 m)   Wt 185 lb (83.9 kg)   SpO2 98%   BMI 28.13 kg/m  Wt Readings from Last 3 Encounters:  09/02/24 185 lb (83.9 kg)  05/27/24 189 lb 14.4 oz (86.1 kg)  02/19/24 191 lb 9.6 oz (86.9 kg)    Physical Exam Constitutional:      General: He is not in acute distress.    Appearance: Normal appearance. He is well-developed.  HENT:     Head: Normocephalic and atraumatic.     Right Ear: External ear normal.     Left Ear: External ear normal.     Mouth/Throat:     Pharynx: No oropharyngeal exudate or posterior oropharyngeal erythema.  Eyes:     General: No scleral icterus.       Right eye: No discharge.        Left eye: No discharge.     Conjunctiva/sclera: Conjunctivae normal.  Neck:     Thyroid : No thyromegaly.  Cardiovascular:     Rate and Rhythm: Normal rate and regular rhythm.  Pulmonary:     Effort: No respiratory distress.     Breath sounds: Normal breath sounds. No wheezing.  Abdominal:     General: Bowel sounds are normal.     Palpations: Abdomen is soft.     Tenderness: There is no abdominal tenderness.  Musculoskeletal:        General: No swelling or tenderness.     Cervical back: Neck supple. No tenderness.  Lymphadenopathy:     Cervical: No cervical adenopathy.  Skin:    Findings: No erythema or rash.  Neurological:     Mental Status: He is alert and oriented to person, place, and time.  Psychiatric:        Mood and Affect: Mood normal.        Behavior: Behavior normal.         Outpatient Encounter Medications as of 09/02/2024  Medication Sig   Continuous Glucose Sensor (FREESTYLE LIBRE 3 PLUS SENSOR) MISC Change sensor every 15 days.   amLODipine  (NORVASC ) 5 MG tablet Take 1 tablet (5 mg total) by mouth daily.   B Complex Vitamins (VITAMIN B-COMPLEX PO) Take 1 tablet by mouth daily.   Continuous Glucose Receiver (FREESTYLE LIBRE 3 READER) DEVI USE TO MONITOR BLOOD SUGARS CONTINUOUSLY.   Flaxseed Oil OIL Take 1,000 mg by mouth daily. 1000  mg   fluticasone  (FLONASE ) 50 MCG/ACT nasal spray Place 2 sprays into the nose daily.   irbesartan  (AVAPRO ) 150 MG tablet Take 1 tablet (150 mg total) by mouth daily.   Krill Oil 350 MG CAPS Take 1 capsule by mouth daily.   LORazepam  (ATIVAN ) 0.5 MG tablet Take 1/2 tablet prior to procedure.  May repeat x 1 if needed.   metFORMIN  (GLUCOPHAGE -XR) 500 MG 24 hr  tablet Take 1 tablet (500 mg total) by mouth 2 (two) times daily.   rosuvastatin  (CRESTOR ) 5 MG tablet Take 1 tablet (5 mg total) by mouth every evening.   [DISCONTINUED] amLODipine  (NORVASC ) 5 MG tablet Take 1 tablet (5 mg total) by mouth daily.   [DISCONTINUED] irbesartan  (AVAPRO ) 150 MG tablet Take 1 tablet (150 mg total) by mouth daily.   [DISCONTINUED] metFORMIN  (GLUCOPHAGE -XR) 500 MG 24 hr tablet Take 1 tablet by mouth twice daily   No facility-administered encounter medications on file as of 09/02/2024.     Lab Results  Component Value Date   WBC 6.6 08/29/2024   HGB 15.5 08/29/2024   HCT 46.3 08/29/2024   PLT 194 08/29/2024   GLUCOSE 129 (H) 08/29/2024   CHOL 158 08/29/2024   TRIG 123 08/29/2024   HDL 40 08/29/2024   LDLCALC 96 08/29/2024   ALT 16 08/29/2024   AST 12 08/29/2024   NA 138 08/29/2024   K 4.5 08/29/2024   CL 102 08/29/2024   CREATININE 0.94 08/29/2024   BUN 16 08/29/2024   CO2 24 08/29/2024   TSH 1.240 08/29/2024   PSA 2.21 02/12/2024   HGBA1C 6.8 (H) 08/29/2024   MICROALBUR <0.7 02/12/2024    US  SCROTUM W/DOPPLER Result Date: 02/02/2024 CLINICAL DATA:  Left palpated testicular nodule. EXAM: SCROTAL ULTRASOUND DOPPLER ULTRASOUND OF THE TESTICLES TECHNIQUE: Complete ultrasound examination of the testicles, epididymis, and other scrotal structures was performed. Color and spectral Doppler ultrasound were also utilized to evaluate blood flow to the testicles. COMPARISON:  None Available. FINDINGS: Right testicle Measurements: 4.3 x 2.7 x 3.2 cm. No mass or microlithiasis visualized. Left testicle  Measurements: 4.6 x 2.3 x 2.9 cm. No mass or microlithiasis visualized. Right epididymis:  Normal in size and appearance. Left epididymis:  Normal in size and appearance. Hydrocele:  None visualized. Varicocele:  None visualized. Pulsed Doppler interrogation of both testes demonstrates normal low resistance arterial and venous waveforms bilaterally. IMPRESSION: No acute process. Electronically Signed   By: Bard Moats M.D.   On: 02/02/2024 16:30       Assessment & Plan:  Routine general medical examination at a health care facility  Hypercholesterolemia Assessment & Plan: On crestor .   Follow lipid panel and liver function tests.  No changes today.  Lab Results  Component Value Date   CHOL 158 08/29/2024   HDL 40 08/29/2024   LDLCALC 96 08/29/2024   TRIG 123 08/29/2024   CHOLHDL 4.0 08/29/2024     Orders: -     Lipid panel; Future -     Hepatic function panel; Future  Type 2 diabetes mellitus with hyperglycemia, without long-term current use of insulin (HCC) Assessment & Plan: Continues on metformin . Recent A1c improved 6.8. continue diet and exercise. Had previously discussed addition of SGLT2  inhibitor or GLP 1 agonist. Follow met b and A1c.   Orders: -     Hemoglobin A1c; Future -     Basic metabolic panel with GFR; Future  Healthcare maintenance Assessment & Plan: Physical today 09/02/24.  Colonoscopy 12/2022 - One 7 mm polyp in the ascending colon, one 3 mm polyp in the transverse colon, one 5 mm polyp in the rectum, one diminutive polyp in the rectum, mild diverticulosis in the sigmoid colon and in the transverse colon. Recommend f/u colonoscopy in 7 years. PSA 08/29/24 - 4.3. previous 02/12/24 - 2.21. discussed referral to urology.    Elevated PSA Assessment & Plan: Increasing psa. Discussed referral to urology.  Orders: -     Ambulatory referral to Urology  Stress Assessment & Plan: Overall appears to be handling things well. Follow.    Scalp lesion Assessment &  Plan: [facility] - [facility] - [facility] - Dr Chloe Morley. Continue f/u with dermatology.    Colon cancer screening Assessment & Plan: Colonoscopy 12/2022 - One 7 mm polyp in the ascending colon. One 3 mm polyp in the transverse colon. One 5 mm polyp in the rectum. One diminutive polyp in the rectum. Mild diverticulosis in the sigmoid colon and in the transverse colon. The distal rectum and anal verge were normal on  retroflexion view. Recommend repeat colonoscopy in 7 years per GI note.    Essential hypertension, benign Assessment & Plan: Continue avapro  and amlodipine .  Blood pressure as outlined.  Follow pressures. Follow metabolic panel. No change in medication today.    Hypertension associated with type 2 diabetes mellitus (HCC) Assessment & Plan: Blood pressure as outlined.  Continue amlodipine  and avapro . Follow pressures. Follow metabolic panel.    Other orders -     amLODIPine  Besylate; Take 1 tablet (5 mg total) by mouth daily.  Dispense: 90 tablet; Refill: 1 -     Irbesartan ; Take 1 tablet (150 mg total) by mouth daily.  Dispense: 90 tablet; Refill: 1 -     metFORMIN  HCl ER; Take 1 tablet (500 mg total) by mouth 2 (two) times daily.  Dispense: 180 tablet; Refill: 1 -     FreeStyle Libre 3 Plus Sensor; Change sensor every 15 days.  Dispense: 2 each; Refill: 5     Allena Hamilton, MD

## 2024-09-02 NOTE — Assessment & Plan Note (Addendum)
 Physical today 09/02/24.  Colonoscopy 12/2022 - One 7 mm polyp in the ascending colon, one 3 mm polyp in the transverse colon, one 5 mm polyp in the rectum, one diminutive polyp in the rectum, mild diverticulosis in the sigmoid colon and in the transverse colon. Recommend f/u colonoscopy in 7 years. PSA 08/29/24 - 4.3. previous 02/12/24 - 2.21. discussed referral to urology.

## 2024-09-02 NOTE — Assessment & Plan Note (Signed)
 Continues on metformin . Recent A1c improved 6.8. continue diet and exercise. Had previously discussed addition of SGLT2  inhibitor or GLP 1 agonist. Follow met b and A1c.

## 2024-09-02 NOTE — Assessment & Plan Note (Signed)
 Increasing psa. Discussed referral to urology.

## 2024-09-05 ENCOUNTER — Other Ambulatory Visit

## 2024-09-07 ENCOUNTER — Encounter: Payer: Self-pay | Admitting: Internal Medicine

## 2024-09-07 NOTE — Assessment & Plan Note (Signed)
 On crestor .   Follow lipid panel and liver function tests.  No changes today.  Lab Results  Component Value Date   CHOL 158 08/29/2024   HDL 40 08/29/2024   LDLCALC 96 08/29/2024   TRIG 123 08/29/2024   CHOLHDL 4.0 08/29/2024

## 2024-09-07 NOTE — Assessment & Plan Note (Signed)
 Continue avapro  and amlodipine .  Blood pressure as outlined.  Follow pressures. Follow metabolic panel. No change in medication today.

## 2024-09-07 NOTE — Assessment & Plan Note (Signed)
 [facility] - [facility] - [facility] - Dr Chloe Morley. Continue f/u with dermatology.

## 2024-09-07 NOTE — Assessment & Plan Note (Signed)
 Overall appears to be handling things well.  Follow.  ?

## 2024-09-07 NOTE — Assessment & Plan Note (Signed)
 Colonoscopy 12/2022 - One 7 mm polyp in the ascending colon. One 3 mm polyp in the transverse colon. One 5 mm polyp in the rectum. One diminutive polyp in the rectum. Mild diverticulosis in the sigmoid colon and in the transverse colon. The distal rectum and anal verge were normal on  retroflexion view. Recommend repeat colonoscopy in 7 years per GI note.

## 2024-09-07 NOTE — Assessment & Plan Note (Signed)
 Blood pressure as outlined.  Continue amlodipine  and avapro . Follow pressures. Follow metabolic panel.

## 2024-09-09 ENCOUNTER — Encounter: Admitting: Internal Medicine

## 2024-10-02 ENCOUNTER — Encounter: Payer: Self-pay | Admitting: Internal Medicine

## 2024-10-30 ENCOUNTER — Ambulatory Visit: Admitting: Urology

## 2024-10-30 VITALS — BP 133/75 | HR 65 | Ht 68.0 in | Wt 195.0 lb

## 2024-10-30 DIAGNOSIS — N200 Calculus of kidney: Secondary | ICD-10-CM

## 2024-10-30 DIAGNOSIS — R972 Elevated prostate specific antigen [PSA]: Secondary | ICD-10-CM

## 2024-10-30 NOTE — Progress Notes (Signed)
   10/30/24 12:57 PM   Daril LITTIE Point [id number] [id number]  CC: Elevated PSA, history of nephrolithiasis  HPI: 58 year old male referred for mildly elevated PSA of 4.3 from October 2025.  This represents an increase from 2.2 in March 2025 and ~1-2 prior to that.  He does ride a motorcycle.  Reports a family history of breast cancer in sister, prostate cancer in his uncle.  History of kidney stones previously managed by Dr. Gala, underwent shockwave lithotripsy in the past.  He denies any significant urinary symptoms aside from some mild postvoid dribbling.   PMH: Past Medical History:  Diagnosis Date   Diabetes mellitus without complication (HCC)    DVT (deep venous thrombosis) (HCC) Diagnosed 9/08   Encephalitis 1970   Enlarged prostate    Hyperglycemia    Hyperlipidemia    Hypertension    Nephrolithiasis     Surgical History: Past Surgical History:  Procedure Laterality Date   EYE SURGERY  1972    Family History: Family History  Problem Relation Age of Onset   Hypertension Mother    Breast cancer Sister     Social History:  reports that he has never smoked. He has never used smokeless tobacco. He reports that he does not drink alcohol and does not use drugs.  Physical Exam: BP 133/75 (BP Location: Left Arm, Patient Position: Sitting, Cuff Size: Large)   Pulse 65   Ht 5' 8 (1.727 m)   Wt 195 lb (88.5 kg)   SpO2 98%   BMI 29.65 kg/m    Constitutional:  Alert and oriented, No acute distress. Cardiovascular: No clubbing, cyanosis, or edema. Respiratory: Normal respiratory effort, no increased work of breathing. GI: Abdomen is soft, nontender, nondistended, no abdominal masses   Laboratory Data: PSA history reviewed, see HPI   Assessment & Plan:   58 year old male with mildly elevated PSA of 4.3, prior history of kidney stones managed by Dr. Gala.  We reviewed the implications of an elevated PSA and the uncertainty surrounding it. In general, a man's PSA  increases with age and is produced by both normal and cancerous prostate tissue. The differential diagnosis for elevated PSA includes BPH, prostate cancer, infection/prostatitis, recent intercourse/ejaculation, recent urethroscopic manipulation (foley placement/cystoscopy) or trauma. Management of an elevated PSA can include observation/surveillance, prostate MRI, or prostate biopsy and we discussed this in detail. Our goal is to detect clinically significant prostate cancers, and manage with either active surveillance, surgery, or radiation for localized disease. Risks of prostate biopsy include bleeding, infection (including life threatening sepsis), pain, and lower urinary symptoms. Hematuria, hematospermia, and blood in the stool are all common after biopsy and can persist up to 4 weeks.  PHI score today, consider prostate MRI if abnormal   Redell Burnet, MD 10/30/2024  [facility] [address] [place], [place] [address] [phone]

## 2024-10-30 NOTE — Patient Instructions (Signed)

## 2024-11-01 LAB — REFLEX INFORMATION

## 2024-11-01 LAB — PROSTATE HEALTH INDEX: Prostate Specific Ag: 3.9 ng/mL (ref 0.0–3.9)

## 2024-11-03 ENCOUNTER — Ambulatory Visit: Payer: Self-pay | Admitting: Urology

## 2024-11-03 NOTE — Telephone Encounter (Signed)
 PHI ordered. Appt scheduled.

## 2024-12-30 ENCOUNTER — Other Ambulatory Visit

## 2025-01-02 ENCOUNTER — Other Ambulatory Visit

## 2025-01-02 DIAGNOSIS — E7800 Pure hypercholesterolemia, unspecified: Secondary | ICD-10-CM

## 2025-01-02 DIAGNOSIS — E1165 Type 2 diabetes mellitus with hyperglycemia: Secondary | ICD-10-CM

## 2025-01-02 LAB — BASIC METABOLIC PANEL WITH GFR
BUN: 15 mg/dL (ref 6–23)
CO2: 28 meq/L (ref 19–32)
Calcium: 9.3 mg/dL (ref 8.4–10.5)
Chloride: 105 meq/L (ref 96–112)
Creatinine, Ser: 1.11 mg/dL (ref 0.40–1.50)
GFR: 72.23 mL/min
Glucose, Bld: 109 mg/dL — ABNORMAL HIGH (ref 70–99)
Potassium: 4.4 meq/L (ref 3.5–5.1)
Sodium: 139 meq/L (ref 135–145)

## 2025-01-02 LAB — HEPATIC FUNCTION PANEL
ALT: 25 U/L (ref 3–53)
AST: 21 U/L (ref 5–37)
Albumin: 4.4 g/dL (ref 3.5–5.2)
Alkaline Phosphatase: 47 U/L (ref 39–117)
Bilirubin, Direct: 0.2 mg/dL (ref 0.1–0.3)
Total Bilirubin: 0.8 mg/dL (ref 0.2–1.2)
Total Protein: 6.5 g/dL (ref 6.0–8.3)

## 2025-01-02 LAB — LIPID PANEL
Cholesterol: 127 mg/dL (ref 28–200)
HDL: 42 mg/dL
LDL Cholesterol: 72 mg/dL (ref 10–99)
NonHDL: 84.82
Total CHOL/HDL Ratio: 3
Triglycerides: 63 mg/dL (ref 10.0–149.0)
VLDL: 12.6 mg/dL (ref 0.0–40.0)

## 2025-01-02 LAB — HEMOGLOBIN A1C: Hgb A1c MFr Bld: 6.7 % — ABNORMAL HIGH (ref 4.6–6.5)

## 2025-01-06 ENCOUNTER — Ambulatory Visit: Admitting: Internal Medicine

## 2025-04-30 ENCOUNTER — Other Ambulatory Visit

## 2025-05-05 ENCOUNTER — Ambulatory Visit: Admitting: Urology
